# Patient Record
Sex: Female | Born: 1945 | Race: White | Smoking: Never smoker
Health system: Southern US, Community
[De-identification: ages and names within clinical notes are randomized; demographics above are authoritative.]

## PROBLEM LIST (undated history)

## (undated) DIAGNOSIS — K219 Gastro-esophageal reflux disease without esophagitis: Secondary | ICD-10-CM

## (undated) DIAGNOSIS — R7303 Prediabetes: Secondary | ICD-10-CM

## (undated) DIAGNOSIS — G4733 Obstructive sleep apnea (adult) (pediatric): Secondary | ICD-10-CM

## (undated) DIAGNOSIS — E785 Hyperlipidemia, unspecified: Secondary | ICD-10-CM

## (undated) DIAGNOSIS — I493 Ventricular premature depolarization: Secondary | ICD-10-CM

## (undated) DIAGNOSIS — D509 Iron deficiency anemia, unspecified: Secondary | ICD-10-CM

## (undated) DIAGNOSIS — D126 Benign neoplasm of colon, unspecified: Secondary | ICD-10-CM

## (undated) HISTORY — DX: Obstructive sleep apnea (adult) (pediatric): G47.33

## (undated) HISTORY — DX: Benign neoplasm of colon, unspecified: D12.6

## (undated) HISTORY — DX: Prediabetes: R73.03

## (undated) HISTORY — DX: Gastro-esophageal reflux disease without esophagitis: K21.9

## (undated) HISTORY — DX: Iron deficiency anemia, unspecified: D50.9

## (undated) HISTORY — PX: COLONOSCOPY: SHX174

## (undated) HISTORY — DX: Ventricular premature depolarization: I49.3

## (undated) HISTORY — DX: Hyperlipidemia, unspecified: E78.5

## (undated) HISTORY — PX: OVARIAN CYST REMOVAL: SHX89

## (undated) HISTORY — PX: TUBAL LIGATION: SHX77

---

## 2017-10-03 ENCOUNTER — Ambulatory Visit (HOSPITAL_COMMUNITY): Payer: Medicare HMO | Attending: Cardiovascular Disease

## 2017-10-03 ENCOUNTER — Encounter: Payer: Self-pay | Admitting: Cardiovascular Disease

## 2017-10-03 ENCOUNTER — Other Ambulatory Visit: Payer: Self-pay

## 2017-10-03 ENCOUNTER — Ambulatory Visit (INDEPENDENT_AMBULATORY_CARE_PROVIDER_SITE_OTHER): Payer: Medicare HMO | Admitting: Cardiovascular Disease

## 2017-10-03 DIAGNOSIS — I10 Essential (primary) hypertension: Secondary | ICD-10-CM | POA: Diagnosis not present

## 2017-10-03 DIAGNOSIS — G4733 Obstructive sleep apnea (adult) (pediatric): Secondary | ICD-10-CM

## 2017-10-03 DIAGNOSIS — R002 Palpitations: Secondary | ICD-10-CM | POA: Insufficient documentation

## 2017-10-03 DIAGNOSIS — E785 Hyperlipidemia, unspecified: Secondary | ICD-10-CM | POA: Insufficient documentation

## 2017-10-03 DIAGNOSIS — I493 Ventricular premature depolarization: Secondary | ICD-10-CM | POA: Diagnosis not present

## 2017-10-03 DIAGNOSIS — E7800 Pure hypercholesterolemia, unspecified: Secondary | ICD-10-CM

## 2017-10-03 LAB — ECHOCARDIOGRAM COMPLETE
Height: 61 in
Weight: 2713.6 oz

## 2017-10-03 MED ORDER — METOPROLOL SUCCINATE ER 25 MG PO TB24: 25.0000 mg | ORAL_TABLET | Freq: Every day | ORAL | 6 refills | Status: DC

## 2017-10-03 NOTE — Progress Notes (Signed)
10/03/2017 Kelly Stone   12-21-1945  [id number]  Primary Physician Kelly Galas, MD Primary Cardiologist: Kelly Harp MD Kelly Stone, [place]  HPI:  Kelly Stone is a 72 y.o. mildly overweight widowed Caucasian female mother of 2, grandmother of 2 grandchildren referred by Dr. 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  She basically has no risk factors other than family history with both parents who have had stents.  She has some mild hyperlipidemia not on statin therapy.  She is never had a heart attack or stroke and denies chest pain or shortness of breath.  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  Does not drink caffeine or alcohol.  She is had palpitations for years more noticeable and frequent over the last several months.  Her primary care doctor obtain a event monitor that showed frequent PVCs.   Current Meds  Medication Sig  . Multiple Vitamins-Minerals (MULTIVITAMIN ADULT PO) Take 1 tablet by mouth daily.     Not on File  Social History   Socioeconomic History  . Marital status: Widowed    Spouse name: Not on file  . Number of children: Not on file  . Years of education: Not on file  . Highest education level: Not on file  Occupational History  . Not on file  Social Needs  . Financial resource strain: Not on file  . Food insecurity:    Worry: Not on file    Inability: Not on file  . Transportation needs:    Medical: Not on file    Non-medical: Not on file  Tobacco Use  . Smoking status: Never Smoker  . Smokeless tobacco: Never Used  Substance and Sexual Activity  . Alcohol use: Never    Frequency: Never  . Drug use: Never  . Sexual activity: Not on file  Lifestyle  . Physical activity:    Days per week: Not on file    Minutes per session: Not on file  . Stress: Not on file  Relationships  . Social connections:    Talks on phone: Not on file    Gets together: Not on  file    Attends religious service: Not on file    Active member of club or organization: Not on file    Attends meetings of clubs or organizations: Not on file    Relationship status: Not on file  . Intimate partner violence:    Fear of current or ex partner: Not on file    Emotionally abused: Not on file    Physically abused: Not on file    Forced sexual activity: Not on file  Other Topics Concern  . Not on file  Social History Narrative  . Not on file     Review of Systems: General: negative for chills, fever, night sweats or weight changes.  Cardiovascular: negative for chest pain, dyspnea on exertion, edema, orthopnea, palpitations, paroxysmal nocturnal dyspnea or shortness of breath Dermatological: negative for rash Respiratory: negative for cough or wheezing Urologic: negative for hematuria Abdominal: negative for nausea, vomiting, diarrhea, bright red blood per rectum, melena, or hematemesis Neurologic: negative for visual changes, syncope, or dizziness All other systems reviewed and are otherwise negative except as noted above.    Blood pressure 126/80, pulse 71, height 5\' 1"  (1.549 m), weight 169 lb 9.6 oz (76.9 kg).  General appearance: alert and no distress Neck: no adenopathy, no  carotid bruit, no JVD, supple, symmetrical, trachea midline and thyroid not enlarged, symmetric, no tenderness/mass/nodules Lungs: clear to auscultation bilaterally Heart: regular rate and rhythm, S1, S2 normal, no murmur, click, rub or gallop Extremities: extremities normal, atraumatic, no cyanosis or edema Pulses: 2+ and symmetric Skin: Skin color, texture, turgor normal. No rashes or lesions Neurologic: Alert and oriented X 3, normal strength and tone. Normal symmetric reflexes. 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  They have been more noticeable and she is been more symptomatic for the last several months.  She has stopped caffeine many months ago and does not drink alcohol.  She is grieving with because of the loss of her husband 2 years ago.  She apparently had a recent event monitor that showed PVCs by her PCP.  I am going to get a 2D echo and begin her empirically on low-dose beta-blockade..  Obstructive sleep apnea Obstructive sleep apnea on CPAP      Kelly Harp MD [facility], [place], [facility] 10/03/2017 10:01 AM

## 2017-10-03 NOTE — Assessment & Plan Note (Signed)
History of hyperlipidemia not on statin therapy. 

## 2017-10-03 NOTE — Assessment & Plan Note (Signed)
Kelly Stone has had PVCs for years.  They have been more noticeable and she is been more symptomatic for the last several months.  She has stopped caffeine many months ago and does not drink alcohol.  She is grieving with because of the loss of her husband 2 years ago.  She apparently had a recent event monitor that showed PVCs by her PCP.  I am going to get a 2D echo and begin her empirically on low-dose beta-blockade.Marland Kitchen

## 2017-10-03 NOTE — Patient Instructions (Signed)
Medication Instructions: Your physician recommends that you continue on your current medications as directed. Please refer to the Current Medication list given to you today.  START Metoprolol Succinate 25 mg daily.   Testing/Procedures: Your physician has requested that you have an echocardiogram. Echocardiography is a painless test that uses sound waves to create images of your heart. It provides your doctor with information about the size and shape of your heart and how well your heart's chambers and valves are working. This procedure takes approximately one hour. There are no restrictions for this procedure.  Follow-Up: Your physician recommends that you schedule a follow-up appointment in: 6 weeks with Dr. Gwenlyn Found.

## 2017-10-03 NOTE — Assessment & Plan Note (Signed)
Obstructive sleep apnea on CPAP

## 2017-10-28 ENCOUNTER — Encounter: Payer: Self-pay | Admitting: Cardiovascular Disease

## 2017-11-03 ENCOUNTER — Telehealth: Payer: Self-pay

## 2017-11-03 DIAGNOSIS — E7800 Pure hypercholesterolemia, unspecified: Secondary | ICD-10-CM

## 2017-11-03 DIAGNOSIS — I493 Ventricular premature depolarization: Secondary | ICD-10-CM

## 2017-11-03 NOTE — Telephone Encounter (Signed)
Per Dr. Gwenlyn Found, get a lipid and liver profile as well as a TSH and free T4. Sent message to patient through MyChart to go to [place] office for lab work.

## 2017-11-20 ENCOUNTER — Other Ambulatory Visit: Payer: Medicare HMO | Admitting: *Deleted

## 2017-11-20 DIAGNOSIS — E7800 Pure hypercholesterolemia, unspecified: Secondary | ICD-10-CM

## 2017-11-20 DIAGNOSIS — I493 Ventricular premature depolarization: Secondary | ICD-10-CM

## 2017-11-20 LAB — HEPATIC FUNCTION PANEL
ALK PHOS: 59 IU/L (ref 39–117)
ALT: 41 IU/L — AB (ref 0–32)
AST: 30 IU/L (ref 0–40)
Albumin: 4.5 g/dL (ref 3.5–4.8)
BILIRUBIN TOTAL: 0.3 mg/dL (ref 0.0–1.2)
BILIRUBIN, DIRECT: 0.09 mg/dL (ref 0.00–0.40)
Total Protein: 6.5 g/dL (ref 6.0–8.5)

## 2017-11-20 LAB — LIPID PANEL
CHOLESTEROL TOTAL: 182 mg/dL (ref 100–199)
Chol/HDL Ratio: 3.4 ratio (ref 0.0–4.4)
HDL: 53 mg/dL (ref >39)
LDL CALC: 111 mg/dL — AB (ref 0–99)
Triglycerides: 90 mg/dL (ref 0–149)
VLDL CHOLESTEROL CAL: 18 mg/dL (ref 5–40)

## 2017-11-20 LAB — TSH: TSH: 4.23 u[IU]/mL (ref 0.450–4.500)

## 2017-11-20 LAB — T4, FREE: FREE T4: 1.02 ng/dL (ref 0.82–1.77)

## 2017-12-03 ENCOUNTER — Ambulatory Visit: Payer: Medicare HMO | Admitting: Cardiovascular Disease

## 2018-07-14 ENCOUNTER — Other Ambulatory Visit: Payer: Self-pay | Admitting: Cardiovascular Disease

## 2018-08-05 ENCOUNTER — Encounter: Payer: Self-pay | Admitting: *Deleted

## 2018-08-05 ENCOUNTER — Telehealth: Payer: Self-pay | Admitting: *Deleted

## 2018-08-05 NOTE — Telephone Encounter (Signed)
Virtual Visit Pre-Appointment Phone Call  "(Name), I am calling you today to discuss your upcoming appointment. We are currently trying to limit exposure to the virus that causes COVID-19 by seeing patients at home rather than in the office."  1. "What is the BEST phone number to call the day of the visit?" - include this in appointment notes  2. Do you have or have access to (through a family member/friend) a smartphone with video capability that we can use for your visit?" a. If yes - list this number in appt notes as cell (if different from BEST phone #) and list the appointment type as a VIDEO visit in appointment notes b. If no - list the appointment type as a PHONE visit in appointment notes  3. Confirm consent - "In the setting of the current Covid19 crisis, you are scheduled for a (phone or video) visit with your provider on (date) at (time).  Just as we do with many in-office visits, in order for you to participate in this visit, we must obtain consent.  If you'd like, I can send this to your mychart (if signed up) or email for you to review.  Otherwise, I can obtain your verbal consent now.  All virtual visits are billed to your insurance company just like a normal visit would be.  By agreeing to a virtual visit, we'd like you to understand that the technology does not allow for your provider to perform an examination, and thus may limit your provider's ability to fully assess your condition. If your provider identifies any concerns that need to be evaluated in person, we will make arrangements to do so.  Finally, though the technology is pretty good, we cannot assure that it will always work on either your or our end, and in the setting of a video visit, we may have to convert it to a phone-only visit.  In either situation, we cannot ensure that we have a secure connection.  Are you willing to proceed?" STAFF: Did the patient verbally acknowledge consent to telehealth visit? Document  YES/NO here: YES  4. Advise patient to be prepared - "Two hours prior to your appointment, go ahead and check your blood pressure, pulse, oxygen saturation, and your weight (if you have the equipment to check those) and write them all down. When your visit starts, your provider will ask you for this information. If you have an Apple Watch or Kardia device, please plan to have heart rate information ready on the day of your appointment. Please have a pen and paper handy nearby the day of the visit as well."  5. Give patient instructions for MyChart download to smartphone OR [email or web address] as below if video visit (depending on what platform provider is using)  6. Inform patient they will receive a phone call 15 minutes prior to their appointment time (may be from unknown caller ID) so they should be prepared to answer    TELEPHONE CALL NOTE  Lajuan Godbee has been deemed a candidate for a follow-up tele-health visit to limit community exposure during the Covid-19 pandemic. I spoke with the patient via phone to ensure availability of phone/video source, confirm preferred email & phone number, and discuss instructions and expectations.  I reminded Destony Prevost to be prepared with any vital sign and/or heart rhythm information that could potentially be obtained via home monitoring, at the time of her visit. I reminded Malkie Wille to expect a phone call prior to her visit.  Venetia Maxon, [place] 08/05/2018 4:39 PM   INSTRUCTIONS FOR DOWNLOADING THE MYCHART APP TO SMARTPHONE  - The patient must first make sure to have activated MyChart and know their login information - If Apple, go to [employer] and type in MyChart in the search bar and download the app. If Android, ask patient to go to [employer] and type in [place] in the search bar and download the app. The app is free but as with any other app downloads, their phone may require them to verify saved payment information or Apple/Android  password.  - The patient will need to then log into the app with their MyChart username and password, and select [facility] as their healthcare provider to link the account. When it is time for your visit, go to the MyChart app, find appointments, and click Begin Video Visit. Be sure to Select Allow for your device to access the Microphone and Camera for your visit. You will then be connected, and your provider will be with you shortly.  **If they have any issues connecting, or need assistance please contact MyChart service desk [phone])**  **If using a computer, in order to ensure the best quality for their visit they will need to use either of the following Internet Browsers: [employer], or Google Chrome**  IF USING DOXIMITY or DOXY.ME - The patient will receive a link just prior to their visit by text.     FULL LENGTH CONSENT FOR TELE-HEALTH VISIT   I hereby voluntarily request, consent and authorize [facility] and its employed or contracted physicians, physician assistants, nurse practitioners or other licensed health care professionals (the Practitioner), to provide me with telemedicine health care services (the Services") as deemed necessary by the treating Practitioner. I acknowledge and consent to receive the Services by the Practitioner via telemedicine. I understand that the telemedicine visit will involve communicating with the Practitioner through live audiovisual communication technology and the disclosure of certain medical information by electronic transmission. I acknowledge that I have been given the opportunity to request an in-person assessment or other available alternative prior to the telemedicine visit and am voluntarily participating in the telemedicine visit.  I understand that I have the right to withhold or withdraw my consent to the use of telemedicine in the course of my care at any time, without affecting my right to future care or treatment,  and that the Practitioner or I may terminate the telemedicine visit at any time. I understand that I have the right to inspect all information obtained and/or recorded in the course of the telemedicine visit and may receive copies of available information for a reasonable fee.  I understand that some of the potential risks of receiving the Services via telemedicine include:   Delay or interruption in medical evaluation due to technological equipment failure or disruption;  Information transmitted may not be sufficient (e.g. poor resolution of images) to allow for appropriate medical decision making by the Practitioner; and/or   In rare instances, security protocols could fail, causing a breach of personal health information.  Furthermore, I acknowledge that it is my responsibility to provide information about my medical history, conditions and care that is complete and accurate to the best of my ability. I acknowledge that Practitioner's advice, recommendations, and/or decision may be based on factors not within their control, such as incomplete or inaccurate data provided by me or distortions of diagnostic images or specimens that may result from electronic transmissions. I understand that  the practice of medicine is not an exact science and that Practitioner makes no warranties or guarantees regarding treatment outcomes. I acknowledge that I will receive a copy of this consent concurrently upon execution via email to the email address I last provided but may also request a printed copy by calling the office of [facility].    I understand that my insurance will be billed for this visit.   I have read or had this consent read to me.  I understand the contents of this consent, which adequately explains the benefits and risks of the Services being provided via telemedicine.   I have been provided ample opportunity to ask questions regarding this consent and the Services and have had my questions  answered to my satisfaction.  I give my informed consent for the services to be provided through the use of telemedicine in my medical care  By participating in this telemedicine visit I agree to the above.        Cardiac Questionnaire:    Since your last visit or hospitalization:    1. Have you been having new or worsening chest pain? YES   2. Have you been having new or worsening shortness of breath? NO 3. Have you been having new or worsening leg swelling, wt gain, or increase in abdominal girth (pants fitting more tightly)? NO   4. Have you had any passing out spells? NO    *A YES to any of these questions would result in the appointment being kept. *If all the answers to these questions are NO, we should indicate that given the current situation regarding the worldwide coronarvirus pandemic, at the recommendation of the CDC, we are looking to limit gatherings in our waiting area, and thus will reschedule their appointment beyond four weeks from today.   _____________   COVID-19 Pre-Screening Questions:   Do you currently have a fever? NO  Have you recently travelled on a cruise, internationally, or to [place], [place], [place], [place], [place], or [place], [place] [employer]) ? NO  Have you been in contact with someone that is currently pending confirmation of Covid19 testing or has been confirmed to have the [place] virus?  NO  Are you currently experiencing fatigue or cough? NO    Spoke with patient and she agreed to have a video visit with Dr. Gwenlyn Found August 07, 2018 at 11:00am. We reviewed patient's allergies, medications, pharmacy, and history.

## 2018-08-05 NOTE — Telephone Encounter (Signed)
Virtual Visit Pre-Appointment Phone Call  "(Name), I am calling you today to discuss your upcoming appointment. We are currently trying to limit exposure to the virus that causes COVID-19 by seeing patients at home rather than in the office."  1. "What is the BEST phone number to call the day of the visit?" - include this in appointment notes  2. Do you have or have access to (through a family member/friend) a smartphone with video capability that we can use for your visit?" a. If yes - list this number in appt notes as cell (if different from BEST phone #) and list the appointment type as a VIDEO visit in appointment notes b. If no - list the appointment type as a PHONE visit in appointment notes  3. Confirm consent - "In the setting of the current Covid19 crisis, you are scheduled for a (phone or video) visit with your provider on (date) at (time).  Just as we do with many in-office visits, in order for you to participate in this visit, we must obtain consent.  If you'd like, I can send this to your mychart (if signed up) or email for you to review.  Otherwise, I can obtain your verbal consent now.  All virtual visits are billed to your insurance company just like a normal visit would be.  By agreeing to a virtual visit, we'd like you to understand that the technology does not allow for your provider to perform an examination, and thus may limit your provider's ability to fully assess your condition. If your provider identifies any concerns that need to be evaluated in person, we will make arrangements to do so.  Finally, though the technology is pretty good, we cannot assure that it will always work on either your or our end, and in the setting of a video visit, we may have to convert it to a phone-only visit.  In either situation, we cannot ensure that we have a secure connection.  Are you willing to proceed?" STAFF: Did the patient verbally acknowledge consent to telehealth visit? Document  YES/NO here: YES  4. Advise patient to be prepared - "Two hours prior to your appointment, go ahead and check your blood pressure, pulse, oxygen saturation, and your weight (if you have the equipment to check those) and write them all down. When your visit starts, your provider will ask you for this information. If you have an Apple Watch or Kardia device, please plan to have heart rate information ready on the day of your appointment. Please have a pen and paper handy nearby the day of the visit as well."  5. Give patient instructions for MyChart download to smartphone OR Doximity/Doxy.me as below if video visit (depending on what platform provider is using)  6. Inform patient they will receive a phone call 15 minutes prior to their appointment time (may be from unknown caller ID) so they should be prepared to answer    TELEPHONE CALL NOTE  Julionna Marczak has been deemed a candidate for a follow-up tele-health visit to limit community exposure during the Covid-19 pandemic. I spoke with the patient via phone to ensure availability of phone/video source, confirm preferred email & phone number, and discuss instructions and expectations.  I reminded Tanashia Ciesla to be prepared with any vital sign and/or heart rhythm information that could potentially be obtained via home monitoring, at the time of her visit. I reminded Emiline Mancebo to expect a phone call prior to her visit.  Venetia Maxon, [place] 08/05/2018 4:33 PM   INSTRUCTIONS FOR DOWNLOADING THE MYCHART APP TO SMARTPHONE  - The patient must first make sure to have activated MyChart and know their login information - If Apple, go to [employer] and type in MyChart in the search bar and download the app. If Android, ask patient to go to [employer] and type in [facility] in the search bar and download the app. The app is free but as with any other app downloads, their phone may require them to verify saved payment information or Apple/Android  password.  - The patient will need to then log into the app with their MyChart username and password, and select [facility] as their healthcare provider to link the account. When it is time for your visit, go to the MyChart app, find appointments, and click Begin Video Visit. Be sure to Select Allow for your device to access the Microphone and Camera for your visit. You will then be connected, and your provider will be with you shortly.  **If they have any issues connecting, or need assistance please contact MyChart service desk [phone])**  **If using a computer, in order to ensure the best quality for their visit they will need to use either of the following Internet Browsers: [employer], or Google Chrome**  IF USING DOXIMITY or DOXY.ME - The patient will receive a link just prior to their visit by text.     FULL LENGTH CONSENT FOR TELE-HEALTH VISIT   I hereby voluntarily request, consent and authorize [facility] and its employed or contracted physicians, physician assistants, nurse practitioners or other licensed health care professionals (the Practitioner), to provide me with telemedicine health care services (the Services") as deemed necessary by the treating Practitioner. I acknowledge and consent to receive the Services by the Practitioner via telemedicine. I understand that the telemedicine visit will involve communicating with the Practitioner through live audiovisual communication technology and the disclosure of certain medical information by electronic transmission. I acknowledge that I have been given the opportunity to request an in-person assessment or other available alternative prior to the telemedicine visit and am voluntarily participating in the telemedicine visit.  I understand that I have the right to withhold or withdraw my consent to the use of telemedicine in the course of my care at any time, without affecting my right to future care or treatment,  and that the Practitioner or I may terminate the telemedicine visit at any time. I understand that I have the right to inspect all information obtained and/or recorded in the course of the telemedicine visit and may receive copies of available information for a reasonable fee.  I understand that some of the potential risks of receiving the Services via telemedicine include:   Delay or interruption in medical evaluation due to technological equipment failure or disruption;  Information transmitted may not be sufficient (e.g. poor resolution of images) to allow for appropriate medical decision making by the Practitioner; and/or   In rare instances, security protocols could fail, causing a breach of personal health information.  Furthermore, I acknowledge that it is my responsibility to provide information about my medical history, conditions and care that is complete and accurate to the best of my ability. I acknowledge that Practitioner's advice, recommendations, and/or decision may be based on factors not within their control, such as incomplete or inaccurate data provided by me or distortions of diagnostic images or specimens that may result from electronic transmissions. I understand that  the practice of medicine is not an exact science and that Practitioner makes no warranties or guarantees regarding treatment outcomes. I acknowledge that I will receive a copy of this consent concurrently upon execution via email to the email address I last provided but may also request a printed copy by calling the office of [facility].    I understand that my insurance will be billed for this visit.   I have read or had this consent read to me.  I understand the contents of this consent, which adequately explains the benefits and risks of the Services being provided via telemedicine.   I have been provided ample opportunity to ask questions regarding this consent and the Services and have had my questions  answered to my satisfaction.  I give my informed consent for the services to be provided through the use of telemedicine in my medical care  By participating in this telemedicine visit I agree to the above.        Cardiac Questionnaire:    Since your last visit or hospitalization:    1. Have you been having new or worsening chest pain? YES   2. Have you been having new or worsening shortness of breath? NO 3. Have you been having new or worsening leg swelling, wt gain, or increase in abdominal girth (pants fitting more tightly)? NO   4. Have you had any passing out spells? NO    *A YES to any of these questions would result in the appointment being kept. *If all the answers to these questions are NO, we should indicate that given the current situation regarding the worldwide coronarvirus pandemic, at the recommendation of the CDC, we are looking to limit gatherings in our waiting area, and thus will reschedule their appointment beyond four weeks from today.   _____________   COVID-19 Pre-Screening Questions:   Do you currently have a fever? NO  Have you recently travelled on a cruise, internationally, or to [place], [place], [place], [place], [place], or [place], [place] [employer]) ? NO  Have you been in contact with someone that is currently pending confirmation of Covid19 testing or has been confirmed to have the [place] virus?  NO  Are you currently experiencing fatigue or cough? NO    Spoke with patient and she agreed to have a video visit with Dr. Gwenlyn Found August 07, 2018 at 11:00am. We reviewed patient's allergies, medications, pharmacy, and history.

## 2018-08-06 ENCOUNTER — Telehealth: Payer: Self-pay | Admitting: Cardiovascular Disease

## 2018-08-06 NOTE — Telephone Encounter (Signed)
Smartphone/ my chart/ virtual consent/ pre reg completed °

## 2018-08-07 ENCOUNTER — Telehealth: Payer: Self-pay

## 2018-08-07 ENCOUNTER — Telehealth (INDEPENDENT_AMBULATORY_CARE_PROVIDER_SITE_OTHER): Payer: Medicare HMO | Admitting: Cardiovascular Disease

## 2018-08-07 DIAGNOSIS — R0789 Other chest pain: Secondary | ICD-10-CM | POA: Diagnosis not present

## 2018-08-07 DIAGNOSIS — I493 Ventricular premature depolarization: Secondary | ICD-10-CM

## 2018-08-07 DIAGNOSIS — E782 Mixed hyperlipidemia: Secondary | ICD-10-CM | POA: Diagnosis not present

## 2018-08-07 NOTE — Telephone Encounter (Signed)
LVM STATING F/U IN 1 YR AND AVS WILL BE SENT TO ADDRESS ON FILE

## 2018-08-07 NOTE — Patient Instructions (Signed)

## 2018-08-07 NOTE — Progress Notes (Signed)
Virtual Visit via Video Note   This visit type was conducted due to national recommendations for restrictions regarding the COVID-19 Pandemic (e.g. social distancing) in an effort to limit this patient's exposure and mitigate transmission in our community.  Due to her co-morbid illnesses, this patient is at least at moderate risk for complications without adequate follow up.  This format is felt to be most appropriate for this patient at this time.  All issues noted in this document were discussed and addressed.  A limited physical exam was performed with this format.  Please refer to the patient's chart for her consent to telehealth for [facility].   Evaluation Performed:  Follow-up visit  Date:  08/07/2018   ID:  Kelly Stone, DOB 1945-07-05, MRN [id number]  Patient Location: Home Provider Location: Home  PCP:  Lysbeth Galas, MD  Cardiologist:  Quay Burow, MD  Electrophysiologist:  None   Chief Complaint: Palpitations and atypical chest pain  History of Present Illness:     Kelly Stone is a 73y.o. mildly overweight widowed Caucasian female mother of 2, grandmother of 2 grandchildren referred by Dr. 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    Her current primary care provider is Dr. Annitta Needs.  She basically has no risk factors other than family history with both parents who have had stents.  She has some mild hyperlipidemia not on statin therapy.  She is never had a heart attack or stroke and denies chest pain or shortness of breath.  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  Does not drink caffeine or alcohol.  She is had palpitations for years more noticeable and frequent over the last several months prior to her initial office visit..  Her primary care doctor obtain a event monitor that showed frequent PVCs.  Since I saw her a year ago a  2D echocardiogram was performed 10/03/2017 that was entirely normal.  She believes that her PVCs were stress related from not seeing her daughter.  Her symptoms have since resolved spontaneously.  I did prescribe low-dose metoprolol which she ended up not taking because of borderline low blood pressure.  She does complain of some atypical symptoms which sound reflux related typically after eating.  She has been on antacids off and off in the past.  Her most recent lipid profile performed 04/25/2018 revealed total cholesterol 179, LDL 106 and HDL 51.  The patient does not have symptoms concerning for COVID-19 infection (fever, chills, cough, or new shortness of breath).    No past medical history on file. No past surgical history on file.   Current Meds  Medication Sig  . Multiple Vitamins-Minerals (MULTIVITAMIN ADULT PO) Take 1 tablet by mouth daily.     Allergies:   Patient has no known allergies.   Social History   Tobacco Use  . Smoking status: Never Smoker  . Smokeless tobacco: Never Used  Substance Use Topics  . Alcohol use: Never    Frequency: Never  . Drug use: Never     Family Hx: The patient's family history is not on file.  ROS:   Please see the history of present illness.     All other systems reviewed and are negative.   Prior CV studies:   The following studies were reviewed today:  None  Labs/Other Tests and Data Reviewed:  EKG:  No ECG reviewed.  Recent Labs: 11/20/2017: ALT 41; TSH 4.230   Recent Lipid Panel Lab Results  Component Value Date/Time   CHOL 182 11/20/2017 12:00 AM   TRIG 90 11/20/2017 12:00 AM   HDL 53 11/20/2017 12:00 AM   CHOLHDL 3.4 11/20/2017 12:00 AM   LDLCALC 111 (H) 11/20/2017 12:00 AM    Wt Readings from Last 3 Encounters:  08/07/18 168 lb (76.2 kg)  10/03/17 169 lb 9.6 oz (76.9 kg)     Objective:    Vital Signs:  BP 118/69   Pulse (!) 58   Ht 5\' 1"  (1.549 m)   Wt 168 lb (76.2 kg)   BMI 31.74 kg/m    VITAL  SIGNS:  reviewed GEN:  no acute distress RESPIRATORY:  normal respiratory effort, symmetric expansion NEURO:  alert and oriented x 3, no obvious focal deficit PSYCH:  normal affect  ASSESSMENT & PLAN:    1. Palpitations- PVCs on event monitoring performed by her prior PCP.  These have since resolved.  She does not drink alcohol or caffeine.  She believes these were stress related.  She ended up not taking the beta-blocker which I prescribed. 2. Hyperlipidemia- lipid profile performed 04/25/2018 revealed total cholesterol 179, LDL 106 and HDL 51 3. Atypical chest pain- sounds like reflux  COVID-19 Education: The signs and symptoms of COVID-19 were discussed with the patient and how to seek care for testing (follow up with PCP or arrange E-visit).  The importance of social distancing was discussed today.  Time:   Today, I have spent 8 minutes with the patient with telehealth technology discussing the above problems.     Medication Adjustments/Labs and Tests Ordered: Current medicines are reviewed at length with the patient today.  Concerns regarding medicines are outlined above.   Tests Ordered: No orders of the defined types were placed in this encounter.   Medication Changes: No orders of the defined types were placed in this encounter.   Disposition:  Follow up in 1 year(s)  Signed, Quay Burow, MD  08/07/2018 11:02 AM    [place]

## 2019-01-08 ENCOUNTER — Encounter: Payer: Self-pay | Admitting: Cardiovascular Disease

## 2019-01-08 ENCOUNTER — Other Ambulatory Visit: Payer: Self-pay

## 2019-01-08 ENCOUNTER — Ambulatory Visit: Payer: Medicare HMO | Admitting: Cardiovascular Disease

## 2019-01-08 DIAGNOSIS — Z8249 Family history of ischemic heart disease and other diseases of the circulatory system: Secondary | ICD-10-CM | POA: Diagnosis not present

## 2019-01-08 DIAGNOSIS — E782 Mixed hyperlipidemia: Secondary | ICD-10-CM

## 2019-01-08 DIAGNOSIS — I493 Ventricular premature depolarization: Secondary | ICD-10-CM | POA: Diagnosis not present

## 2019-01-08 NOTE — Assessment & Plan Note (Signed)
History of hyperlipidemia not on statin therapy with recent lipid profile performed 12/31/2018 revealing a total cholesterol 197 and LDL 129.  She does admit to dietary indiscretion.  She is not on statin therapy.  We talked about the importance of dietary modification she is aware of.  If her coronary calcium score comes back elevated we will focus on risk factor modification LDL reduction.Marland Kitchen

## 2019-01-08 NOTE — Assessment & Plan Note (Signed)
History of PVCs in the past.  She is it does not drink caffeine or alcohol.  She does have obstructive sleep apnea on CPAP.  She can predict what her PVCs are exacerbated based on her stress level.

## 2019-01-08 NOTE — Telephone Encounter (Signed)
Contacted pt 9/25 to follow up on whther she will present for appt today. Pt states she is on the way to [facility] office

## 2019-01-08 NOTE — Assessment & Plan Note (Signed)
Both parents have stents.  She does have some atypical chest pain which sounds more like reflux however I am going to get a coronary calcium score to risk stratify.

## 2019-01-08 NOTE — Patient Instructions (Signed)
Medication Instructions:  Your physician recommends that you continue on your current medications as directed. Please refer to the Current Medication list given to you today.  If you need a refill on your cardiac medications before your next appointment, please call your pharmacy.   Lab work: NONE If you have labs (blood work) drawn today and your tests are completely normal, you will receive your results only by: Marland Kitchen MyChart Message (if you have MyChart) OR . A paper copy in the mail If you have any lab test that is abnormal or we need to change your treatment, we will call you to review the results.  Testing/Procedures: Your physician has recommended that you have a coronary calcium scan. This will cost $150 out-of-pocket.  Coronary Calcium Scan A coronary calcium scan is an imaging test used to look for deposits of calcium and other fatty materials (plaques) in the inner lining of the blood vessels of the heart (coronary arteries). These deposits of calcium and plaques can partly clog and narrow the coronary arteries without producing any symptoms or warning signs. This puts a person at risk for a heart attack. This test can detect these deposits before symptoms develop. Tell a health care provider about:  Any allergies you have.  All medicines you are taking, including vitamins, herbs, eye drops, creams, and over-the-counter medicines.  Any problems you or family members have had with anesthetic medicines.  Any blood disorders you have.  Any surgeries you have had.  Any medical conditions you have.  Whether you are pregnant or may be pregnant. What are the risks? Generally, this is a safe procedure. However, problems may occur, including:  Harm to a pregnant woman and her unborn baby. This test involves the use of radiation. Radiation exposure can be dangerous to a pregnant woman and her unborn baby. If you are pregnant, you generally should not have this procedure done.   Slight increase in the risk of cancer. This is because of the radiation involved in the test. What happens before the procedure? No preparation is needed for this procedure. What happens during the procedure?   You will undress and remove any jewelry around your neck or chest.  You will put on a hospital gown.  Sticky electrodes will be placed on your chest. The electrodes will be connected to an electrocardiogram (ECG) machine to record a tracing of the electrical activity of your heart.  A CT scanner will take pictures of your heart. During this time, you will be asked to lie still and hold your breath for 2-3 seconds while a picture of your heart is being taken. The procedure may vary among health care providers and hospitals. What happens after the procedure?  You can get dressed.  You can return to your normal activities.  It is up to you to get the results of your test. Ask your health care provider, or the department that is doing the test, when your results will be ready. Summary  A coronary calcium scan is an imaging test used to look for deposits of calcium and other fatty materials (plaques) in the inner lining of the blood vessels of the heart (coronary arteries).  Generally, this is a safe procedure. Tell your health care provider if you are pregnant or may be pregnant.  No preparation is needed for this procedure.  A CT scanner will take pictures of your heart.  You can return to your normal activities after the scan is done. This information is not  intended to replace advice given to you by your health care provider. Make sure you discuss any questions you have with your health care provider.  Follow-Up: At [facility], you and your health needs are our priority.  As part of our continuing mission to provide you with exceptional heart care, we have created designated Provider Care Teams.  These Care Teams include your primary Cardiologist (physician) and Advanced  Practice Providers (APPs -  Physician Assistants and Nurse Practitioners) who all work together to provide you with the care you need, when you need it. You will need a follow up appointment in 12 months with Dr. Quay Burow.  Please call our office 2 months in advance to schedule this appointment.

## 2019-01-08 NOTE — Progress Notes (Signed)
01/08/2019 Kelly Stone   05-30-1945  [id number]  Primary Physician Kelly Needs, MD Primary Cardiologist: Kelly Harp MD Kelly Stone, [place]  HPI:  Kelly Stone is a 73 y.o.  mildly overweight widowed Caucasian female mother of 2, grandmother of 2 grandchildren referred by Dr. 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   Her current primary care provider is Dr. Annitta Stone.   I last saw her virtually in the office 08/07/2018. She basically has no risk factors other than family history with both parents who have had stents. She has some mild hyperlipidemia not on statin therapy. She is never had a heart attack or stroke and denies chest pain or shortness of breath. 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 Does not drink caffeine or alcohol. She is had palpitations for years more noticeable and frequent over the last several months prior to her initial office visit.. Her primary care doctor obtain a event monitor that showed frequent PVCs.  A 2D echocardiogram was performed 10/03/2017 that was entirely normal.  She believes that her PVCs were stress related from not seeing her daughter.  Her symptoms have since resolved spontaneously.  I did prescribe low-dose metoprolol which she ended up not taking because of borderline low blood pressure.  She does complain of some atypical symptoms which sound reflux related typically after eating.  She has been on antacids off and off in the past.  Her most recent lipid profile performed 12/31/2018 revealed a total cholesterol of 197, slightly elevated from her previous readings to 3 months before, LDL 129 up from 96 and HDL of 49.  She does admit to dietary indiscretion.  Since I saw her 5 months ago she is remained stable.  She denies chest pain or shortness of breath.  She does get rare feelings of weakness  which has improved with CPAP.  She has had recent escalation in her PVCs related to stress from work.  Current Meds  Medication Sig  . Multiple Vitamins-Minerals (MULTIVITAMIN ADULT PO) Take 1 tablet by mouth daily.     No Known Allergies  Social History   Socioeconomic History  . Marital status: Widowed    Spouse name: Not on file  . Number of children: Not on file  . Years of education: Not on file  . Highest education level: Not on file  Occupational History  . Not on file  Social Stone  . Financial resource strain: Not on file  . Food insecurity    Worry: Not on file    Inability: Not on file  . Transportation Stone    Medical: Not on file    Non-medical: Not on file  Tobacco Use  . Smoking status: Never Smoker  . Smokeless tobacco: Never Used  Substance and Sexual Activity  . Alcohol use: Never    Frequency: Never  . Drug use: Never  . Sexual activity: Not on file  Lifestyle  . Physical activity    Days per week: Not on file    Minutes per session: Not on file  . Stress: Not on file  Relationships  . Social Herbalist on phone: Not on file    Gets together: Not on file    Attends religious service: Not on file    Active member of club or organization:  Not on file    Attends meetings of clubs or organizations: Not on file    Relationship status: Not on file  . Intimate partner violence    Fear of current or ex partner: Not on file    Emotionally abused: Not on file    Physically abused: Not on file    Forced sexual activity: Not on file  Other Topics Concern  . Not on file  Social History Narrative  . Not on file     Review of Systems: General: negative for chills, fever, night sweats or weight changes.  Cardiovascular: negative for chest pain, dyspnea on exertion, edema, orthopnea, palpitations, paroxysmal nocturnal dyspnea or shortness of breath Dermatological: negative for rash Respiratory: negative for cough or wheezing Urologic:  negative for hematuria Abdominal: negative for nausea, vomiting, diarrhea, bright red blood per rectum, melena, or hematemesis Neurologic: negative for visual changes, syncope, or dizziness All other systems reviewed and are otherwise negative except as noted above.    Blood pressure (!) 152/82, pulse (!) 58, height 5\' 1"  (1.549 m), weight 168 lb 12.8 oz (76.6 kg).  General appearance: alert and no distress Neck: no adenopathy, no carotid bruit, no JVD, supple, symmetrical, trachea midline and thyroid not enlarged, symmetric, no tenderness/mass/nodules Lungs: clear to auscultation bilaterally Heart: regular rate and rhythm, S1, S2 normal, no murmur, click, rub or gallop Extremities: extremities normal, atraumatic, no cyanosis or edema Pulses: 2+ and symmetric Skin: Skin color, texture, turgor normal. No rashes or lesions Neurologic: Alert and oriented X 3, normal strength and tone. Normal symmetric reflexes. Normal coordination and gait  EKG sinus bradycardia 58 without ST or T wave changes.  I personally reviewed this EKG.  ASSESSMENT AND PLAN:   Family history of heart disease Both parents have stents.  She does have some atypical chest pain which sounds more like reflux however I am going to get a coronary calcium score to risk stratify.  PVC's (premature ventricular contractions) History of PVCs in the past.  She is it does not drink caffeine or alcohol.  She does have obstructive sleep apnea on CPAP.  She can predict what her PVCs are exacerbated based on her stress level.  Hyperlipidemia History of hyperlipidemia not on statin therapy with recent lipid profile performed 12/31/2018 revealing a total cholesterol 197 and LDL 129.  She does admit to dietary indiscretion.  She is not on statin therapy.  We talked about the importance of dietary modification she is aware of.  If her coronary calcium score comes back elevated we will focus on risk factor modification LDL  reduction.Kelly Harp MD FACP,FACC,FAHA, [facility] 01/08/2019 9:19 AM

## 2019-02-18 ENCOUNTER — Ambulatory Visit (INDEPENDENT_AMBULATORY_CARE_PROVIDER_SITE_OTHER)
Admission: RE | Admit: 2019-02-18 | Discharge: 2019-02-18 | Disposition: A | Discharge status: Home / Self Care | Payer: Self-pay | Source: Ambulatory Visit | Attending: Cardiovascular Disease | Admitting: Cardiovascular Disease

## 2019-02-18 ENCOUNTER — Other Ambulatory Visit: Payer: Self-pay

## 2019-02-18 DIAGNOSIS — Z8249 Family history of ischemic heart disease and other diseases of the circulatory system: Secondary | ICD-10-CM

## 2019-03-05 ENCOUNTER — Telehealth: Payer: Self-pay | Admitting: Cardiovascular Disease

## 2019-03-05 NOTE — Telephone Encounter (Signed)
Spoke with pt and had recent appt with PCP and in reviewing the Ca score there was noted mild cardiac enlargement and pt's PCP asked if maybe a repeat echo needed to be done Also pt has noted on occasion while walking a head fullness and feels like maybe is not walking a straight line Will forward to Dr Gwenlyn Found for review and recommendations ./cy

## 2019-03-05 NOTE — Telephone Encounter (Signed)
Pt aware of Dr Berry's response ./cy 

## 2019-03-05 NOTE — Telephone Encounter (Signed)
Patient is calling in regards to some questions about test results.

## 2019-03-05 NOTE — Telephone Encounter (Signed)
2D from 1 yr ago showed nl cardiac size and nl LV FXN. No need to repeat . Cor CA score 0 w/o ANY evid of CAD

## 2019-03-18 ENCOUNTER — Encounter: Payer: Self-pay | Admitting: Critical Care Medicine

## 2019-03-18 NOTE — Progress Notes (Signed)
Synopsis: Referred in December 2020 for abnormal chest CT by Annitta Needs, MD.  Subjective:   PATIENT ID: Kelly Stone GENDER: female DOB: [birth year], MRN: [id number]  Chief Complaint  Patient presents with  . Consult    Patient has been referred here for pulmonary scarring. Patient states that she has a cough with yellow sputum and states that she feels like she constatly has to clear her throat.    Kelly Stone is a 73 year old woman referred for evaluation of an abnormal CT scan.  She has an occasional cough associated with throat clearing, but denies significant sputum production, dyspnea, wheezing.  She has no activity limitation and walks 1 mile per day.  She endorses allergies with postnasal drip and sneezing.  She has GERD with frequent symptoms which she only treats with Tums.  She has a history of bronchitis which was frequent for a few years when she was off allergy shots about 20 or 30 years ago.  These episodes would last several weeks at a time.  She has no known history of other pulmonary infections or childhood pulmonary issues.  She never smoked or vaped.  She was never exposed to asbestos, but her husband worked as an Forensic psychologist and she is concerned that she could have had exposure through him.  Her family history is notable for lung disease in her father, he underwent a left lower lobectomy for an unknown chronic disease.  She has not yet had her seasonal flu shot and does not want one.     Past Medical History:  Diagnosis Date  . Adenomatous colon polyp   . GERD (gastroesophageal reflux disease)   . HLD (hyperlipidemia)   . Iron deficiency anemia   . OSA (obstructive sleep apnea)   . Prediabetes   . PVC's (premature ventricular contractions)      Family History  Problem Relation Age of Onset  . CAD Mother   . Diabetes Mother   . CAD Father   . Stroke Father   . Arthritis Father   . Breast cancer Sister   . Arthritis Sister   . Stroke Brother   . Hypertension  Brother   . Hyperlipidemia Brother   . Diabetes Brother      Past Surgical History:  Procedure Laterality Date  . COLONOSCOPY    . OVARIAN CYST REMOVAL    . TUBAL LIGATION      Social History   Socioeconomic History  . Marital status: Widowed    Spouse name: Not on file  . Number of children: Not on file  . Years of education: Not on file  . Highest education level: Not on file  Occupational History  . Not on file  Social Needs  . Financial resource strain: Not on file  . Food insecurity    Worry: Not on file    Inability: Not on file  . Transportation needs    Medical: Not on file    Non-medical: Not on file  Tobacco Use  . Smoking status: Never Smoker  . Smokeless tobacco: Never Used  Substance and Sexual Activity  . Alcohol use: Never    Frequency: Never  . Drug use: Never  . Sexual activity: Not on file  Lifestyle  . Physical activity    Days per week: Not on file    Minutes per session: Not on file  . Stress: Not on file  Relationships  . Social connections    Talks on phone: Not on file  Gets together: Not on file    Attends religious service: Not on file    Active member of club or organization: Not on file    Attends meetings of clubs or organizations: Not on file    Relationship status: Not on file  . Intimate partner violence    Fear of current or ex partner: Not on file    Emotionally abused: Not on file    Physically abused: Not on file    Forced sexual activity: Not on file  Other Topics Concern  . Not on file  Social History Narrative  . Not on file     No Known Allergies   Immunization History  Administered Date(s) Administered  . Influenza, Seasonal, Injecte, Preservative Fre 03/05/2010, 01/14/2012  . Tdap 12/06/2013    Outpatient Medications Prior to Visit  Medication Sig Dispense Refill  . metFORMIN (GLUCOPHAGE) 500 MG tablet Take 500 mg by mouth daily.    . Multiple Vitamins-Minerals (MULTIVITAMIN ADULT PO) Take 1 tablet by  mouth daily.     No facility-administered medications prior to visit.     Review of Systems  Constitutional: Negative for chills, fever and weight loss.  HENT:       Post-nasal drip, throat clearing  Eyes: Negative.   Respiratory: Positive for cough and sputum production. Negative for shortness of breath and wheezing.   Cardiovascular: Negative for chest pain and leg swelling.  Gastrointestinal: Positive for heartburn. Negative for blood in stool, nausea and vomiting.  Genitourinary: Negative.   Musculoskeletal: Negative.        Hip pain  Neurological: Negative.   Endo/Heme/Allergies: Positive for environmental allergies.     Objective:   Vitals:   03/19/19 1348  BP: 102/62  Pulse: 69  Temp: 97.8 F (36.6 C)  TempSrc: Temporal  SpO2: 96%  Weight: 173 lb (78.5 kg)  Height: 5' 2.99" (1.6 m)   96% on   RA BMI Readings from Last 3 Encounters:  03/19/19 30.65 kg/m  01/08/19 31.89 kg/m  08/07/18 31.74 kg/m   Wt Readings from Last 3 Encounters:  03/19/19 173 lb (78.5 kg)  01/08/19 168 lb 12.8 oz (76.6 kg)  08/07/18 168 lb (76.2 kg)    Physical Exam Vitals signs reviewed.  Constitutional:      General: She is not in acute distress.    Appearance: Normal appearance. She is not ill-appearing.  HENT:     Head: Normocephalic and atraumatic.     Comments: Small right-sided mobile, subcentimeter soft mass along her Heide Spark reports that has been this way for many years unchanged.    Nose:     Comments: Deferred due to masking requirement.    Mouth/Throat:     Comments: Deferred due to masking requirement. Eyes:     General: No scleral icterus. Neck:     Musculoskeletal: Neck supple.  Cardiovascular:     Rate and Rhythm: Normal rate and regular rhythm.     Heart sounds: No murmur.  Pulmonary:     Comments: Breathing comfortably on room air, no conversational dyspnea.  CTA B. Abdominal:     General: There is no distension.     Palpations: Abdomen is soft.      Tenderness: There is no abdominal tenderness.  Musculoskeletal:        General: No swelling or deformity.  Lymphadenopathy:     Cervical: No cervical adenopathy.  Skin:    General: Skin is warm and dry.     Findings: No rash.  Neurological:     General: No focal deficit present.     Mental Status: She is alert.     Motor: No weakness.     Coordination: Coordination normal.  Psychiatric:        Mood and Affect: Mood normal.        Behavior: Behavior normal.      CBC No results found for: WBC, RBC, HGB, HCT, PLT, MCV, MCH, MCHC, RDW, LYMPHSABS, MONOABS, EOSABS, BASOSABS  CHEMISTRY No results for input(s): NA, K, CL, CO2, GLUCOSE, BUN, CREATININE, CALCIUM, MG, PHOS in the last 168 hours. CrCl cannot be calculated (No successful lab value found.).   Chest Imaging- films reviewed: CT cardiac score 02/18/2019, lung films reviewed - airway thickening and mild dilation in the right middle lobe and lingula, less so in the proximal bilateral lower lobes.  Linear scar in RLL and inferior lingula, superior left lower lobe.  Likely mucous plugging in anterior medial RML.   Pulmonary Functions Testing Results: No flowsheet data found.  Echocardiogram 10/03/2017: LVEF 60 to [id number], normal diastolic function.  Normal LA, RV, RA.  Trivial TR, otherwise normal valves.     Assessment & Plan:     ICD-10-CM   1. Abnormal chest CT  R93.89   2. Abnormal findings on diagnostic imaging of lung  R91.8 CT Chest Wo Contrast    Abnormal chest CT, possible bronchiectasis versus acute inflammation due to bronchitis. -Complete a full chest CT to further evaluate -Recommend optimizing GERD management  GERD -Start twice daily Pepcid; agree with her concerns regarding long-term use of PPIs  RTC PRN. I will follow up with her after her CT scan and let her know if additional workup is required.    Current Outpatient Medications:  .  metFORMIN (GLUCOPHAGE) 500 MG tablet, Take 500 mg by mouth daily.,  Disp: , Rfl:  .  Multiple Vitamins-Minerals (MULTIVITAMIN ADULT PO), Take 1 tablet by mouth daily., Disp: , Rfl:    Julian Hy, DO [facility] 03/19/2019 2:02 PM

## 2019-03-19 ENCOUNTER — Other Ambulatory Visit: Payer: Self-pay

## 2019-03-19 ENCOUNTER — Encounter: Payer: Self-pay | Admitting: Critical Care Medicine

## 2019-03-19 ENCOUNTER — Ambulatory Visit (INDEPENDENT_AMBULATORY_CARE_PROVIDER_SITE_OTHER): Payer: Medicare HMO | Admitting: Critical Care Medicine

## 2019-03-19 VITALS — BP 102/62 | HR 69 | Temp 97.8°F | Ht 62.99 in | Wt 173.0 lb

## 2019-03-19 DIAGNOSIS — R918 Other nonspecific abnormal finding of lung field: Secondary | ICD-10-CM

## 2019-03-19 DIAGNOSIS — R9389 Abnormal findings on diagnostic imaging of other specified body structures: Secondary | ICD-10-CM | POA: Diagnosis not present

## 2019-03-19 MED ORDER — FAMOTIDINE 20 MG PO TABS: 20.0000 mg | ORAL_TABLET | Freq: Two times a day (BID) | ORAL | 3 refills | Status: DC

## 2019-03-19 NOTE — Patient Instructions (Addendum)
Thank you for visiting Dr. Carlis Abbott at [facility]. We recommend the following: Orders Placed This Encounter  Procedures  . CT Chest Wo Contrast   Orders Placed This Encounter  Procedures  . CT Chest Wo Contrast    Standing Status:   Future    Standing Expiration Date:   05/18/2020    Order Specific Question:   ** REASON FOR EXAM (FREE TEXT)    Answer:   evaluate nodules and airway thickening vs bronchiectasis seen on coronary CT    Order Specific Question:   Preferred imaging location?    Answer:   [facility]    Order Specific Question:   Radiology Contrast Protocol - do NOT remove file path    Answer:   [email or web address]    Meds ordered this encounter  Medications  . famotidine (PEPCID) 20 MG tablet    Sig: Take 1 tablet (20 mg total) by mouth 2 (two) times daily.    Dispense:  30 tablet    Refill:  3    Return if symptoms worsen or fail to improve.    Please do your part to reduce the spread of COVID-19.

## 2019-03-30 ENCOUNTER — Inpatient Hospital Stay: Admission: RE | Admit: 2019-03-30 | Payer: Medicare HMO | Source: Ambulatory Visit

## 2019-04-02 ENCOUNTER — Ambulatory Visit (INDEPENDENT_AMBULATORY_CARE_PROVIDER_SITE_OTHER)
Admission: RE | Admit: 2019-04-02 | Discharge: 2019-04-02 | Disposition: A | Discharge status: Home / Self Care | Payer: Medicare HMO | Source: Ambulatory Visit | Attending: Critical Care Medicine | Admitting: Critical Care Medicine

## 2019-04-02 ENCOUNTER — Other Ambulatory Visit: Payer: Self-pay

## 2019-04-02 DIAGNOSIS — R918 Other nonspecific abnormal finding of lung field: Secondary | ICD-10-CM | POA: Diagnosis not present

## 2019-11-28 LAB — COLOGUARD

## 2019-12-18 LAB — COLOGUARD: COLOGUARD: NEGATIVE

## 2020-08-18 ENCOUNTER — Encounter: Payer: Self-pay | Admitting: Cardiovascular Disease

## 2020-08-18 ENCOUNTER — Ambulatory Visit: Payer: Medicare HMO | Admitting: Cardiovascular Disease

## 2020-08-18 ENCOUNTER — Other Ambulatory Visit: Payer: Self-pay

## 2020-08-18 DIAGNOSIS — G4733 Obstructive sleep apnea (adult) (pediatric): Secondary | ICD-10-CM

## 2020-08-18 DIAGNOSIS — E782 Mixed hyperlipidemia: Secondary | ICD-10-CM

## 2020-08-18 DIAGNOSIS — I493 Ventricular premature depolarization: Secondary | ICD-10-CM

## 2020-08-18 NOTE — Assessment & Plan Note (Signed)
History of obstructive sleep apnea on CPAP which she benefits from 

## 2020-08-18 NOTE — Assessment & Plan Note (Signed)
History of hyperlipidemia not on statin therapy with lipid profile performed 08/02/2020 by her PCP revealing a total cholesterol 175, LDL of 102 and HDL of 61.

## 2020-08-18 NOTE — Progress Notes (Signed)
08/18/2020 Kelly Stone   15-Sep-1945  [id number]  Primary Physician Annitta Needs, MD Primary Cardiologist: Lorretta Harp MD [name], [place]  HPI:  Kelly Stone is a 75 y.o..mildly overweight widowed Caucasian female mother of 2, grandmother of 2 grandchildren referred by Dr. Mammie Lorenzo for evaluation treatment of intermittent PVCs.Her current primary care provider is Dr. Annitta Needs.  I last saw her in the office 01/08/2019. She basically has no risk factors other than family history with both parents who have had stents. She has some mild hyperlipidemia not on statin therapy. She is never had a heart attack or stroke and denies chest pain or shortness of breath. 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 Does not drink caffeine or alcohol. She is had palpitations for years more noticeable and frequent over the last several monthsprior to her initial office visit.. Her primary care doctor obtain a event monitor that showed frequent PVCs.  A 2D echocardiogram was performed 10/03/2017 that was entirely normal. She believes that her PVCs were stress related from not seeing her daughter. Her symptoms have since resolved spontaneously. I did prescribe low-dose metoprolol which she ended up not taking because of borderline low blood pressure. She does complain of some atypical symptoms which sound reflux related typically after eating. She has been on antacids off and off in the past. Her most recent lipid profile performed 12/31/2018 revealed a total cholesterol of 197, slightly elevated from her previous readings to 3 months before, LDL 129 up from 96 and HDL of 49.  She does admit to dietary indiscretion.  Since I saw her in the office a year ago she continues to do well.  She still wears her CPAP.  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  She denies chest pain or shortness of breath.  Current Meds  Medication Sig  . Multiple Vitamins-Minerals (MULTIVITAMIN ADULT PO) Take 1 tablet by mouth daily.     No Known Allergies  Social History   Socioeconomic History  . Marital status: Widowed    Spouse name: Not on file  . Number of children: Not on file  . Years of education: Not on file  . Highest education level: Not on file  Occupational History  . Not on file  Tobacco Use  . Smoking status: Never Smoker  . Smokeless tobacco: Never Used  Vaping Use  . Vaping Use: Never used  Substance and Sexual Activity  . Alcohol use: Never  . Drug use: Never  . Sexual activity: Not on file  Other Topics Concern  . Not on file  Social History Narrative  . Not on file   Social Determinants of Health   Financial Resource Strain: Not on file  Food Insecurity: Not on file  Transportation Needs: Not on file  Physical Activity: Not on file  Stress: Not on file  Social Connections: Not on file  Intimate Partner Violence: Not on file     Review of Systems: General: negative for chills, fever, night sweats or weight changes.  Cardiovascular: negative for chest pain, dyspnea on exertion, edema, orthopnea, palpitations, paroxysmal nocturnal dyspnea or shortness of breath Dermatological: negative for rash Respiratory: negative for cough or wheezing Urologic: negative for hematuria Abdominal: negative for nausea, vomiting, diarrhea, bright red blood per rectum, melena, or hematemesis Neurologic:  negative for visual changes, syncope, or dizziness All other systems reviewed and are otherwise negative except as noted above.    Blood pressure 128/70, pulse (!) 54, height 5\' 2"  (1.575 m), weight 151 lb 6.4 oz (68.7 kg), SpO2 97 %.  General appearance: alert and no distress Neck: no adenopathy, no carotid bruit, no JVD, supple, symmetrical, trachea midline and thyroid not enlarged, symmetric, no  tenderness/mass/nodules Lungs: clear to auscultation bilaterally Heart: regular rate and rhythm, S1, S2 normal, no murmur, click, rub or gallop Extremities: extremities normal, atraumatic, no cyanosis or edema Pulses: 2+ and symmetric Skin: Skin color, texture, turgor normal. No rashes or lesions Neurologic: Alert and oriented X 3, normal strength and tone. Normal symmetric reflexes. Normal coordination and gait  EKG sinus bradycardia 54 with low QRS voltage.  Personally reviewed this EKG.  ASSESSMENT AND PLAN:   PVC's (premature ventricular contractions) History of PVCs in the past thought to be related to stress.  She no longer has palpitations.  She does not drink caffeine or alcohol.  Hyperlipidemia History of hyperlipidemia not on statin therapy with lipid profile performed 08/02/2020 by her PCP revealing a total cholesterol 175, LDL of 102 and HDL of 61.  Obstructive sleep apnea History of obstructive sleep apnea on CPAP which she benefits from.      Lorretta Harp MD [place], [facility] 08/18/2020 8:41 AM

## 2020-08-18 NOTE — Assessment & Plan Note (Signed)
History of PVCs in the past thought to be related to stress.  She no longer has palpitations.  She does not drink caffeine or alcohol.

## 2020-08-18 NOTE — Patient Instructions (Signed)

## 2020-11-22 ENCOUNTER — Ambulatory Visit: Payer: Medicare HMO | Admitting: Cardiovascular Disease

## 2021-09-28 IMAGING — CT CT CHEST W/O CM
2 of 3 series · 15 of 36 positions shown, 18 images · non-contrast
Comparison: 02/18/2019 cardiac CT.

CLINICAL DATA: 73-year-old female with suspected postinflammatory
lung scarring on cardiac CT last month.

EXAM:
CT CHEST WITHOUT CONTRAST
TECHNIQUE: Multidetector CT imaging of the chest was performed following the
standard protocol without IV contrast.

[Series 2: thorax · axial · 0.69mm/px · z∈[-292,-54]mm · 12 of 141 slices shown, 15 images]
[im 11/141  mediastinal]
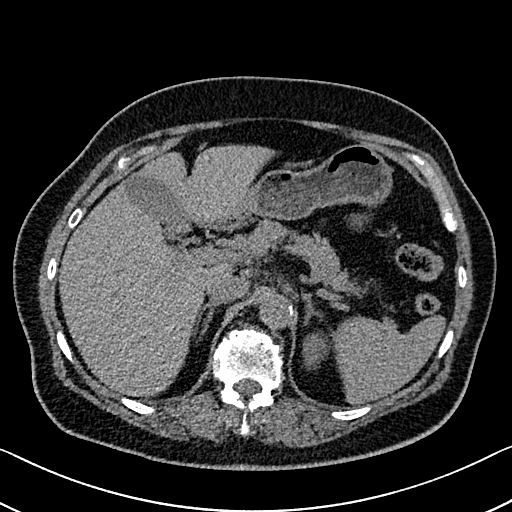
[im 11/141  lung]
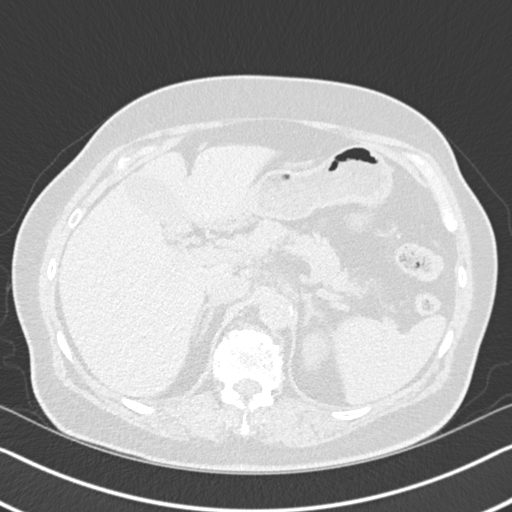
[im 21/141  lung]
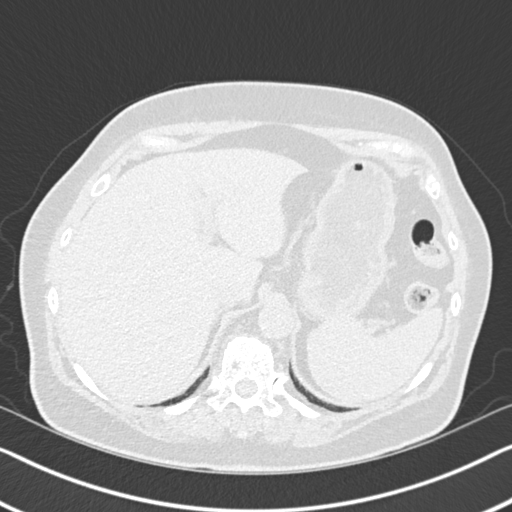
[im 32/141  lung]
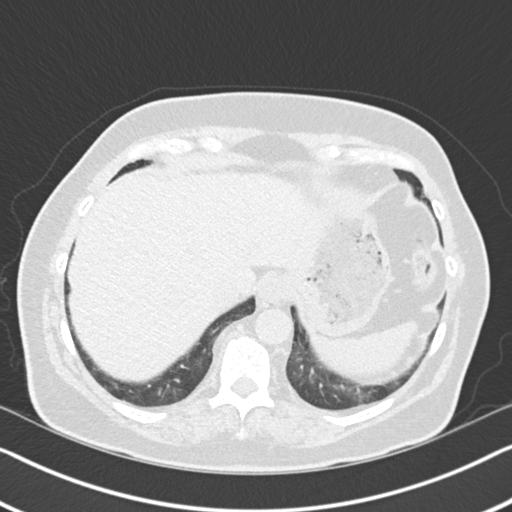
[im 42/141  lung]
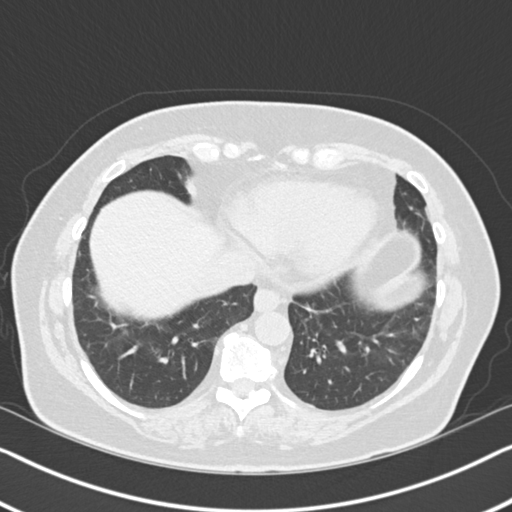
[im 52/141  mediastinal]
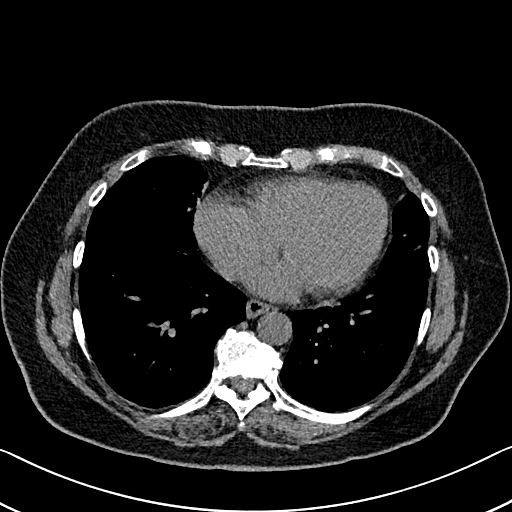
[im 52/141  lung]
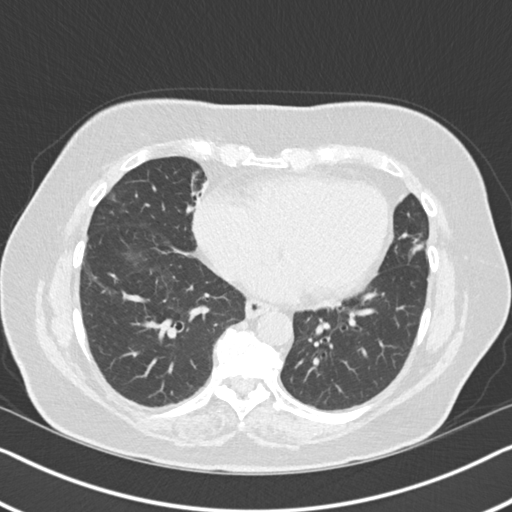
[im 63/141  lung]
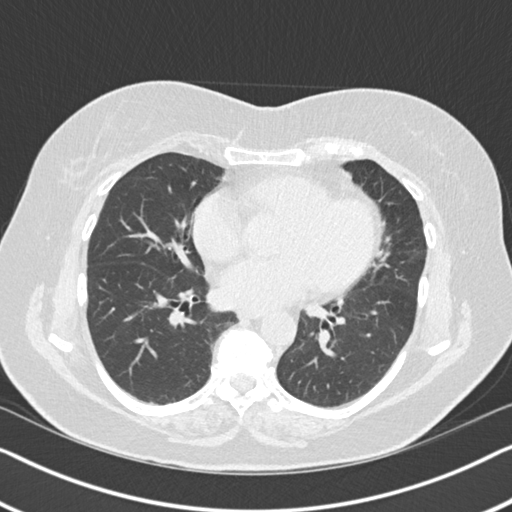
[im 78/141  lung]
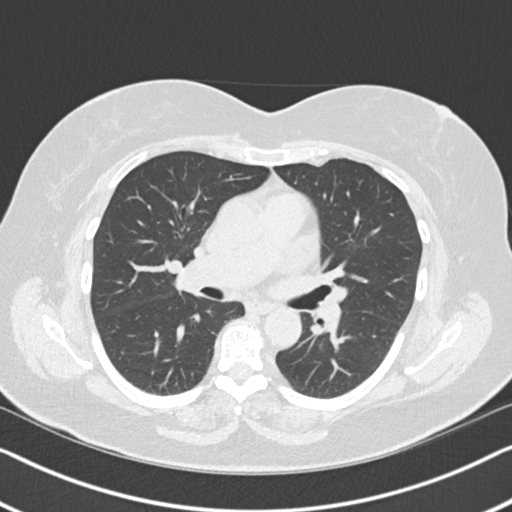
[im 89/141  lung]
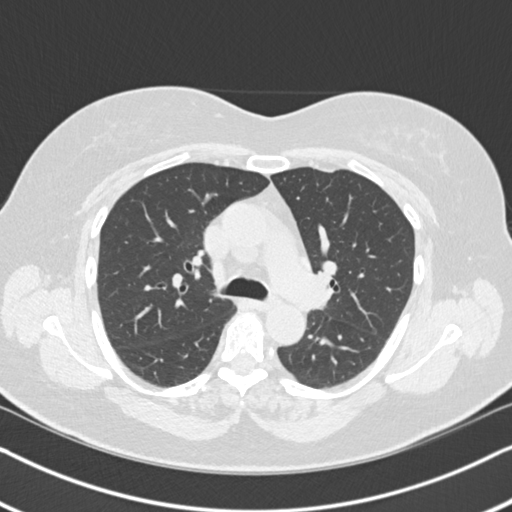
[im 99/141  mediastinal]
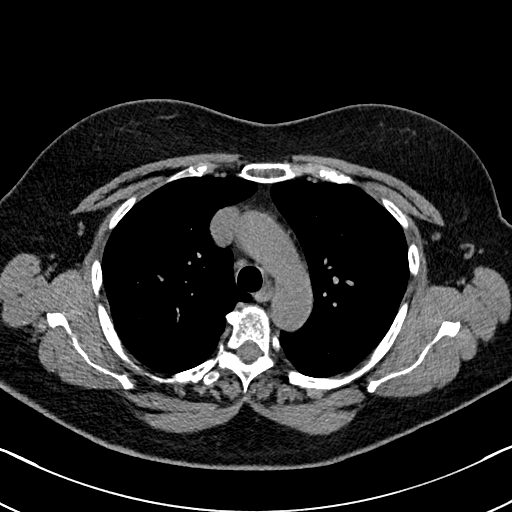
[im 99/141  lung]
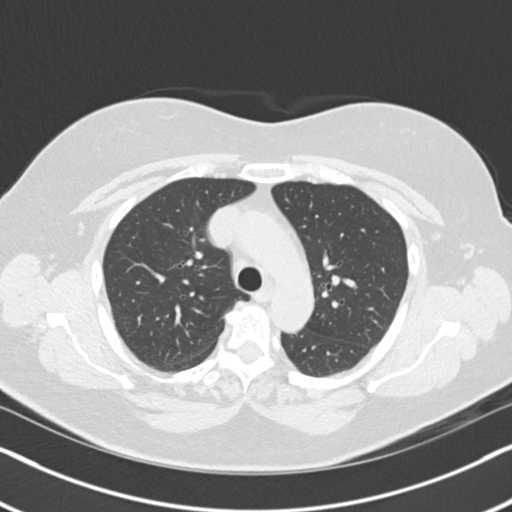
[im 109/141  lung]
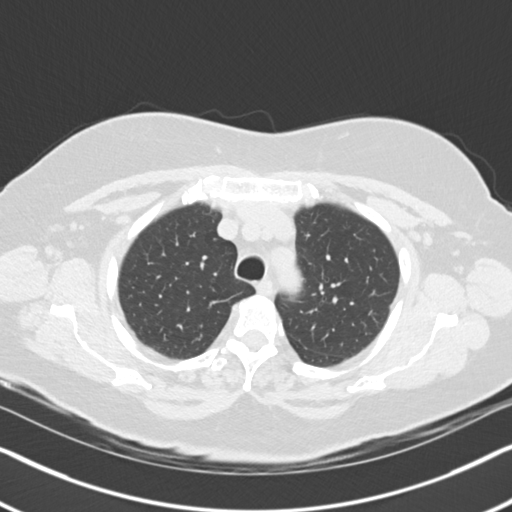
[im 120/141  lung]
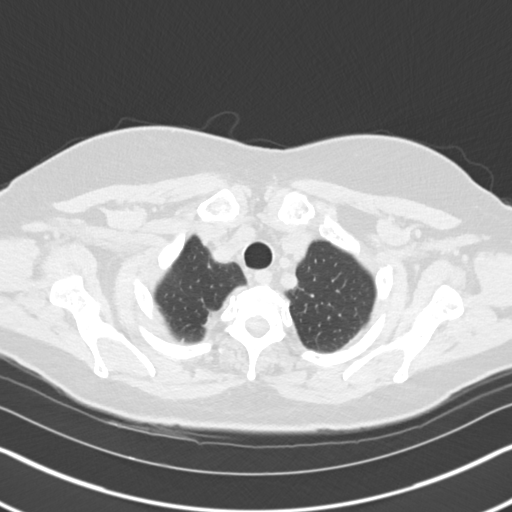
[im 130/141  lung]
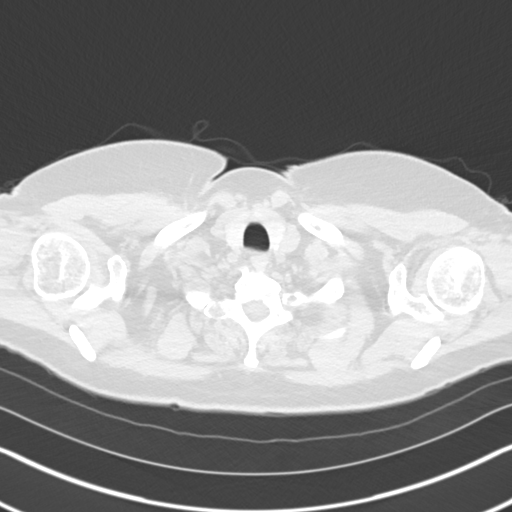

[Series 5: coronal · coronal · 0.59mm/px · 3 of 113 slices shown]
[im 23/113  lung]
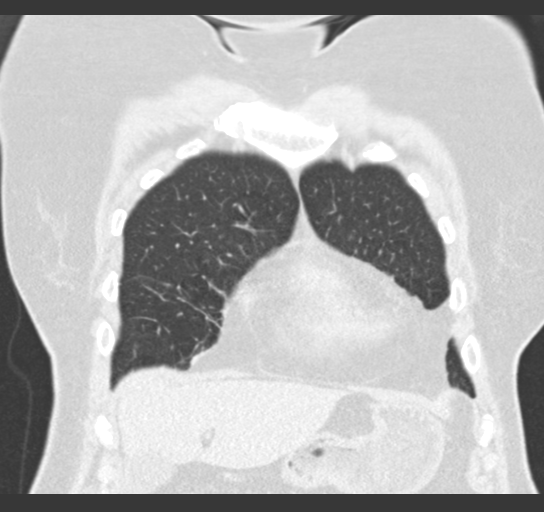
[im 45/113  lung]
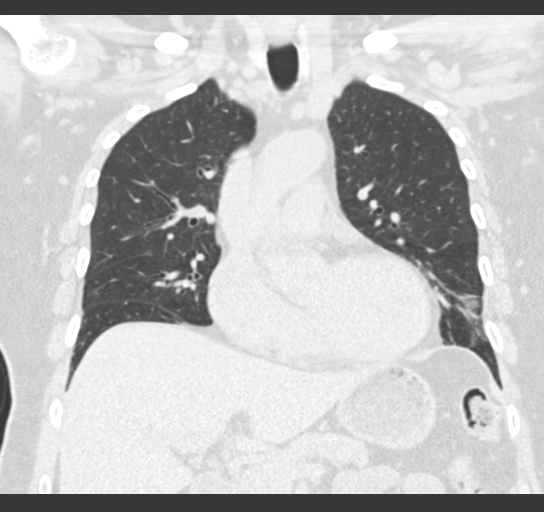
[im 68/113  lung]
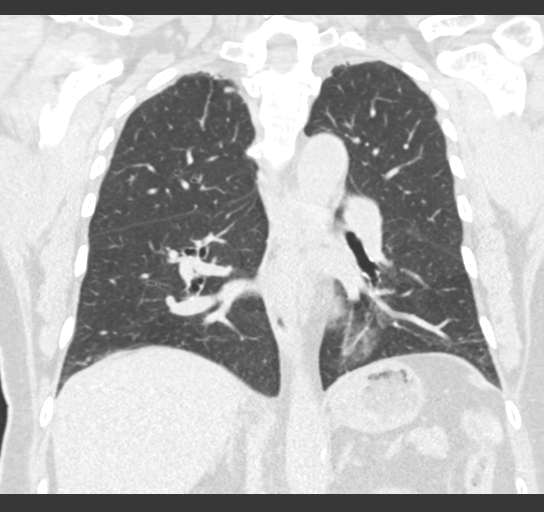

[15 of 36 positions shown; findings below may reference images not displayed]

FINDINGS: Cardiovascular: Vascular patency is not evaluated in the absence of
IV contrast. Mild for age calcified atherosclerosis of the visible
aorta, primarily in the upper abdomen. No cardiomegaly or
pericardial effusion.

Mediastinum/Nodes: Negative. No lymphadenopathy.

Lungs/Pleura: Major airways are patent with probable trace retained
secretions in the trachea on series 3, image 34. Unchanged linear
scarring in the lingula, medial segment of the right middle lobe,
and both anterior basal lower lobe segments. No associated nodular
pulmonary opacity. And no superimposed acute pulmonary findings. No
pleural effusion.

Upper Abdomen: Negative visible noncontrast liver, gallbladder,
spleen, pancreas, adrenal glands, kidneys and bowel.

Musculoskeletal: Thoracic spine disc and endplate degeneration with
intermittent vacuum disc. No acute or suspicious osseous lesion.
IMPRESSION: 1. Linear pulmonary scarring in the bilateral middle and lower lobes
is stable since last month.
2. No discrete pulmonary nodule. No superimposed acute findings in
the chest.

## 2023-12-23 ENCOUNTER — Ambulatory Visit: Attending: Cardiovascular Disease | Admitting: Cardiovascular Disease

## 2023-12-23 ENCOUNTER — Encounter: Payer: Self-pay | Admitting: Cardiovascular Disease

## 2023-12-23 VITALS — BP 110/62 | HR 59 | Ht 62.0 in | Wt 148.7 lb

## 2023-12-23 DIAGNOSIS — E782 Mixed hyperlipidemia: Secondary | ICD-10-CM

## 2023-12-23 DIAGNOSIS — I493 Ventricular premature depolarization: Secondary | ICD-10-CM

## 2023-12-23 DIAGNOSIS — G4733 Obstructive sleep apnea (adult) (pediatric): Secondary | ICD-10-CM | POA: Diagnosis not present

## 2023-12-23 DIAGNOSIS — R079 Chest pain, unspecified: Secondary | ICD-10-CM | POA: Diagnosis not present

## 2023-12-23 MED ORDER — PANTOPRAZOLE SODIUM 40 MG PO TBEC: 40.0000 mg | DELAYED_RELEASE_TABLET | Freq: Every day | ORAL | 3 refills | Status: AC

## 2023-12-23 NOTE — Progress Notes (Unsigned)
 12/23/2023 Kelly Stone   [birth year]  [id number]  Primary Physician Denney Sayre, MD Primary Cardiologist: Dorn Stone Lesches MD [facility] [place], [place]  HPI:  Kelly Stone is a 78 y.o.  mildly overweight widowed Caucasian female mother of 2, grandmother of 2 grandchildren referred by Dr. Maree in [facility] for evaluation treatment of intermittent PVCs.    Her current primary care provider is Dr. Sayre Denney.   I last saw her in the office 08/18/2020.  She basically has no risk factors other than family history with both parents who have had stents.  She has some mild hyperlipidemia not on statin therapy.  She is never had a heart attack or stroke and denies chest pain or shortness of breath.  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  Does not drink caffeine or alcohol.  She is had palpitations for years more noticeable and frequent over the last several months prior to her initial office visit..  Her primary care doctor obtain a event monitor that showed frequent PVCs.   A 2D echocardiogram was performed 10/03/2017 that was entirely normal.  She believes that her PVCs were stress related from not seeing her daughter.  Her symptoms have since resolved spontaneously.  I did prescribe low-dose metoprolol  which she ended up not taking because of borderline low blood pressure.  She does complain of some atypical symptoms which sound reflux related typically after eating.  She has been on antacids off and off in the past.  Her most recent lipid profile performed 12/31/2018 revealed a total cholesterol of 197, slightly elevated from her previous readings to 3 months before, LDL 129 up from 96 and HDL of 49.  She does admit to dietary indiscretion.   Since I saw her in the office 3 years ago she continues to do well.  She was diagnosed with CLL stage 0.  She still gets occasional PVCs.  She walks  twice a day without symptoms.  She still works as an Airline pilot twice a week however during taxation she works 50 to 60-hour weeks.  She has developed some upper abdominal, lower chest pain recently mostly at night when she lies down which sounds like reflux.  She did have a coronary calcium score 02/18/2019 which was 0.   No outpatient medications have been marked as taking for the 12/23/23 encounter (Office Visit) with Kelly Dorn JINNY, MD.     No Known Allergies  Social History   Socioeconomic History   Marital status: Widowed    Spouse name: Not on file   Number of children: Not on file   Years of education: Not on file   Highest education level: Not on file  Occupational History   Not on file  Tobacco Use   Smoking status: Never   Smokeless tobacco: Never  Vaping Use   Vaping status: Never Used  Substance and Sexual Activity   Alcohol use: Never   Drug use: Never   Sexual activity: Not on file  Other Topics Concern   Not on file  Social History Narrative   Not on file   Social Drivers of Health   Financial Resource Strain: Not on file  Food Insecurity: Not on file  Transportation Needs: Not on file  Physical Activity: Not on file  Stress: Not on file  Social Connections: Not on file  Intimate Partner Violence: Not on file     Review of Systems: General: negative for chills, fever, night sweats or weight changes.  Cardiovascular: negative for chest pain, dyspnea on exertion, edema, orthopnea, palpitations, paroxysmal nocturnal dyspnea or shortness of breath Dermatological: negative for rash Respiratory: negative for cough or wheezing Urologic: negative for hematuria Abdominal: negative for nausea, vomiting, diarrhea, bright red blood per rectum, melena, or hematemesis Neurologic: negative for visual changes, syncope, or dizziness All other systems reviewed and are otherwise negative except as noted above.    Blood pressure 110/62, pulse (!) 59, height 5' 2  (1.575 m), weight 148 lb 11.2 oz (67.4 kg), SpO2 97%.  General appearance: alert and no distress Neck: no adenopathy, no carotid bruit, no JVD, supple, symmetrical, trachea midline, and thyroid not enlarged, symmetric, no tenderness/mass/nodules Lungs: clear to auscultation bilaterally Heart: regular rate and rhythm, S1, S2 normal, no murmur, click, rub or gallop Extremities: extremities normal, atraumatic, no cyanosis or edema Pulses: 2+ and symmetric Skin: Skin color, texture, turgor normal. No rashes or lesions Neurologic: Grossly normal  EKG EKG Interpretation Date/Time:  Tuesday December 23 2023 14:56:15 EDT Ventricular Rate:  59 PR Interval:  124 QRS Duration:  80 QT Interval:  404 QTC Calculation: 399 R Axis:   20  Text Interpretation: Sinus bradycardia with sinus arrhythmia Low voltage QRS No previous ECGs available Confirmed by Stone Carrier [id number]) on 12/23/2023 3:11:02 PM    ASSESSMENT AND PLAN:   PVC's (premature ventricular contractions) History of PVCs in the past documented by her PCP by event monitoring.  These were related to stress in the past and are really are of little clinical significance to her.  Hyperlipidemia History of hyperlipidemia not on statin therapy with lipid profile performed 10/31/2023 revealing total cholesterol 157, LDL of 98 and HDL 42.  Obstructive sleep apnea History of obstructive sleep apnea on CPAP     Carrier Kelly Court MD [facility], [facility] 12/23/2023 3:23 PM

## 2023-12-23 NOTE — Assessment & Plan Note (Signed)
 History of PVCs in the past documented by her PCP by event monitoring.  These were related to stress in the past and are really are of little clinical significance to her.

## 2023-12-23 NOTE — Assessment & Plan Note (Signed)
 History of hyperlipidemia not on statin therapy with lipid profile performed 10/31/2023 revealing total cholesterol 157, LDL of 98 and HDL 42.

## 2023-12-23 NOTE — Assessment & Plan Note (Signed)
 History of obstructive sleep apnea on CPAP.

## 2023-12-23 NOTE — Patient Instructions (Signed)
 Medication Instructions:  Your physician has recommended you make the following change in your medication:   -Start pantoprazole  (protonix ) 40mg  once daily.   *If you need a refill on your cardiac medications before your next appointment, please call your pharmacy*  Follow-Up: At [facility], you and your health needs are our priority.  As part of our continuing mission to provide you with exceptional heart care, our providers are all part of one team.  This team includes your primary Cardiologist (physician) and Advanced Practice Providers or APPs (Physician Assistants and Nurse Practitioners) who all work together to provide you with the care you need, when you need it.  Your next appointment:   3 month(s)  Provider:   Orren Fabry, PA-C, Dayna Dunn, PA-C, Kathleen Johnson, PA-C, Damien Braver, NP, or Katlyn West, NP        Then, Dorn Lesches, MD will plan to see you again in 6 month(s). \

## 2024-02-03 ENCOUNTER — Encounter: Payer: Self-pay | Admitting: Cardiovascular Disease

## 2024-02-06 ENCOUNTER — Encounter: Payer: Self-pay | Admitting: Cardiovascular Disease

## 2024-02-06 DIAGNOSIS — R079 Chest pain, unspecified: Secondary | ICD-10-CM

## 2024-02-12 ENCOUNTER — Other Ambulatory Visit: Payer: Self-pay | Admitting: Cardiovascular Disease

## 2024-02-12 DIAGNOSIS — R079 Chest pain, unspecified: Secondary | ICD-10-CM

## 2024-02-13 ENCOUNTER — Ambulatory Visit (HOSPITAL_COMMUNITY): Admission: RE | Admit: 2024-02-13 | Source: Ambulatory Visit

## 2024-02-13 NOTE — Addendum Note (Signed)
 Addended by: CHAUVIGNE, Francene Mcerlean on: 02/13/2024 03:24 PM   Modules accepted: Orders

## 2024-02-25 ENCOUNTER — Ambulatory Visit (HOSPITAL_COMMUNITY)

## 2024-02-26 ENCOUNTER — Encounter: Payer: Self-pay | Admitting: Cardiovascular Disease

## 2024-02-26 DIAGNOSIS — I493 Ventricular premature depolarization: Secondary | ICD-10-CM

## 2024-02-26 DIAGNOSIS — E782 Mixed hyperlipidemia: Secondary | ICD-10-CM

## 2024-02-26 DIAGNOSIS — R079 Chest pain, unspecified: Secondary | ICD-10-CM

## 2024-02-27 ENCOUNTER — Telehealth (HOSPITAL_COMMUNITY): Payer: Self-pay | Admitting: *Deleted

## 2024-02-27 NOTE — Telephone Encounter (Signed)
 Reminder call given for upcoming GXT for 03/05/24 at 3:45

## 2024-03-01 ENCOUNTER — Ambulatory Visit: Attending: Cardiovascular Disease

## 2024-03-01 DIAGNOSIS — R079 Chest pain, unspecified: Secondary | ICD-10-CM

## 2024-03-01 DIAGNOSIS — E782 Mixed hyperlipidemia: Secondary | ICD-10-CM

## 2024-03-01 DIAGNOSIS — I493 Ventricular premature depolarization: Secondary | ICD-10-CM

## 2024-03-01 NOTE — Progress Notes (Unsigned)
 Enrolled for [employer] to mail a ZIO XT long term holter monitor to the patients address on file.

## 2024-03-03 ENCOUNTER — Ambulatory Visit (HOSPITAL_COMMUNITY)
Admission: RE | Admit: 2024-03-03 | Discharge: 2024-03-03 | Disposition: A | Discharge status: Home / Self Care | Payer: Self-pay | Source: Ambulatory Visit | Attending: Cardiology | Admitting: Cardiology

## 2024-03-03 DIAGNOSIS — E782 Mixed hyperlipidemia: Secondary | ICD-10-CM | POA: Insufficient documentation

## 2024-03-03 DIAGNOSIS — R079 Chest pain, unspecified: Secondary | ICD-10-CM | POA: Insufficient documentation

## 2024-03-03 DIAGNOSIS — I493 Ventricular premature depolarization: Secondary | ICD-10-CM | POA: Insufficient documentation

## 2024-03-04 ENCOUNTER — Ambulatory Visit: Payer: Self-pay | Admitting: Cardiovascular Disease

## 2024-03-05 ENCOUNTER — Ambulatory Visit (HOSPITAL_COMMUNITY)
Admission: RE | Admit: 2024-03-05 | Discharge: 2024-03-05 | Disposition: A | Discharge status: Home / Self Care | Source: Ambulatory Visit | Attending: Cardiovascular Disease | Admitting: Cardiovascular Disease

## 2024-03-05 DIAGNOSIS — R079 Chest pain, unspecified: Secondary | ICD-10-CM | POA: Insufficient documentation

## 2024-03-05 LAB — EXERCISE TOLERANCE TEST
Angina Index: 0
Duke Treadmill Score: 6
Estimated workload: 7
Exercise duration (min): 6 min
Exercise duration (sec): 0 s
MPHR: 144 {beats}/min
Peak HR: 129 {beats}/min
Percent HR: 89 %
Rest HR: 68 {beats}/min
ST Depression (mm): 0 mm

## 2024-03-06 ENCOUNTER — Ambulatory Visit: Payer: Self-pay | Admitting: Cardiovascular Disease

## 2024-03-16 ENCOUNTER — Encounter: Payer: Self-pay | Admitting: Cardiovascular Disease

## 2024-03-19 DIAGNOSIS — I493 Ventricular premature depolarization: Secondary | ICD-10-CM

## 2024-03-19 DIAGNOSIS — E782 Mixed hyperlipidemia: Secondary | ICD-10-CM | POA: Diagnosis not present

## 2024-03-19 DIAGNOSIS — R079 Chest pain, unspecified: Secondary | ICD-10-CM

## 2024-03-24 ENCOUNTER — Ambulatory Visit: Admitting: Physician Assistant

## 2024-03-24 ENCOUNTER — Ambulatory Visit: Admitting: Nurse Practitioner

## 2024-03-25 ENCOUNTER — Ambulatory Visit: Admitting: Nurse Practitioner

## 2024-04-06 ENCOUNTER — Telehealth (HOSPITAL_COMMUNITY): Payer: Self-pay | Admitting: *Deleted

## 2024-04-06 ENCOUNTER — Other Ambulatory Visit: Payer: Self-pay | Admitting: Cardiovascular Disease

## 2024-04-06 DIAGNOSIS — R079 Chest pain, unspecified: Secondary | ICD-10-CM

## 2024-04-06 NOTE — Telephone Encounter (Signed)
 Patient given detailed instructions per Myocardial Perfusion Study Information Sheet for the test on 04/09/24 Patient notified to arrive 15 minutes early and that it is imperative to arrive on time for appointment to keep from having the test rescheduled.  If you need to cancel or reschedule your appointment, please call the office within 24 hours of your appointment. . Patient verbalized understanding. Claudene Ronal Quale, RN

## 2024-04-09 ENCOUNTER — Encounter (HOSPITAL_COMMUNITY)

## 2024-04-12 NOTE — Progress Notes (Unsigned)
 " Cardiology Office Note:    Date:  04/13/2024   ID:  Kelly Stone, DOB July 19, 1945, MRN [id number]  PCP:  Denney Sayre, MD   [facility] Providers Cardiologist:  Dorn Lesches, MD     Referring MD: Denney Sayre, MD   Chief complaint: 53-month follow-up     History of Present Illness:   Kelly Stone is a 78 y.o. female with a hx of PVCs, IDA, GERD, HLD, OSA on CPAP, prediabetes presenting today for 39-month follow-up of PVCs.  Seen in 2019 by Dr. Lesches for evaluation following her PCPs cardiac event monitor showing frequent PVCs.  At the time she was noted to still be grieving the loss of her husband from 2 years prior.  Echocardiogram 10/03/2017 demonstrating LVEF 605%, normal LV function, no RWMA, normal diastolic parameters, normal AV, normal MV, normal RV.  Started on metoprolol  succinate 25 mg daily.  At follow-up a year later patient reported resolution in PVCs, was not taking prescribed metoprolol , believed to have been stress related.  With significant family history of cardiac disease, coronary calcium scoring was ordered.  CT cardiac scoring on 02/18/2019 showed CAC score of 0, normal coronary arteries with normal coronary origins.  Has historically been compliant on her CPAP.  Exercise tolerance test on 03/05/2024, stress ECG neg for ischemia, low risk treadmill score, METS 7.0.   Zio monitor worn for 6 days and 22 hours revealed: Patient had a min HR of 45 bpm, max HR of 176 bpm, and avg HR of 67 bpm. Predominant underlying rhythm was Sinus Rhythm. 8 Supraventricular Tachycardia runs occurred, the run with the fastest interval lasting 20 beats with a max rate of 176 bpm (avg 159 bpm); the run with the fastest interval was also the longest. Junctional Rhythm was present. Supraventricular Tachycardia and Junctional Rhythm were detected within +/- 45 seconds of symptomatic patient event(s). Isolated SVEs were rare (<1.0%), SVE  Couplets were rare (<1.0%), and SVE Triplets were  rare (<1.0%). Isolated VEs were rare (<1.0%, 2211), VE Triplets were rare (<1.0%, 1), and no VE Couplets were present.   Presents independently, appears stable from a cardiovascular standpoint. She denies chest pain,  dyspnea, orthopnea, n, v, dark/tarry/bloody stools, hematuria, dizziness, syncope, edema, weight gain.  She does report noticing increased palpitations over the last few months, occurring at rest and with exertion. It feels more like a pounding sensation or an extra heart beat. No associated symptoms. Historical palpitations exacerbated by increased periods of stress. She reports that since September, her 88 year old son was diagnosed with autism, ADHD, and anxiety, and moved back in with her until his medications can be adjusted. She states this has created increased stress, especially given that his wife also has anxiety which can be difficult to manage at times. Patient is trying to stay active by walking around a mile a day.   ROS:   Please see the history of present illness.    All other systems reviewed and are negative.     Past Medical History:  Diagnosis Date   Adenomatous colon polyp    GERD (gastroesophageal reflux disease)    HLD (hyperlipidemia)    Iron deficiency anemia    OSA (obstructive sleep apnea)    CPAP   Prediabetes    PVC's (premature ventricular contractions)     Past Surgical History:  Procedure Laterality Date   COLONOSCOPY     OVARIAN CYST REMOVAL     TUBAL LIGATION      Current  Medications: Active Medications[1]   Allergies:   Patient has no known allergies.   Social History   Socioeconomic History   Marital status: Widowed    Spouse name: Not on file   Number of children: Not on file   Years of education: Not on file   Highest education level: Not on file  Occupational History   Not on file  Tobacco Use   Smoking status: Never   Smokeless tobacco: Never  Vaping Use   Vaping status: Never Used  Substance and Sexual Activity    Alcohol use: Never   Drug use: Never   Sexual activity: Not on file  Other Topics Concern   Not on file  Social History Narrative   Not on file   Social Drivers of Health   Tobacco Use: Low Risk (04/13/2024)   Patient History    Smoking Tobacco Use: Never    Smokeless Tobacco Use: Never    Passive Exposure: Not on file  Financial Resource Strain: Not on file  Food Insecurity: Not on file  Transportation Needs: Not on file  Physical Activity: Not on file  Stress: Not on file  Social Connections: Not on file  Depression (EYV7-0): Not on file  Alcohol Screen: Not on file  Housing: Not on file  Utilities: Not on file  Health Literacy: Not on file     Family History: The patient's family history includes Arthritis in her father and sister; Breast cancer in her sister; CAD in her father and mother; Diabetes in her brother and mother; Hyperlipidemia in her brother; Hypertension in her brother; Stroke in her brother and father.  EKGs/Labs/Other Studies Reviewed:    The following studies were reviewed today:  EKG Interpretation Date/Time:  Tuesday April 13 2024 08:10:42 EST Ventricular Rate:  54 PR Interval:  152 QRS Duration:  84 QT Interval:  422 QTC Calculation: 400 R Axis:   31  Text Interpretation: Sinus bradycardia No significant change from prior study Confirmed by Jalexis Breed [phone]) on 04/13/2024 8:21:56 AM    Recent Labs: No results found for requested labs within last 365 days.  Recent Lipid Panel    Component Value Date/Time   CHOL 182 11/20/2017 0000   TRIG 90 11/20/2017 0000   HDL 53 11/20/2017 0000   CHOLHDL 3.4 11/20/2017 0000   LDLCALC 111 (H) 11/20/2017 0000     Risk Assessment/Calculations:                Physical Exam:    VS:  BP 132/74 (Cuff Size: Normal)   Pulse (!) 54   Ht 5' 1 (1.549 m)   Wt 149 lb (67.6 kg)   SpO2 97%   BMI 28.15 kg/m        Wt Readings from Last 3 Encounters:  04/13/24 149 lb (67.6 kg)   12/23/23 148 lb 11.2 oz (67.4 kg)  08/18/20 151 lb 6.4 oz (68.7 kg)     GEN:  Well nourished, well developed in no acute distress HEENT: Normal NECK:  No carotid bruits CARDIAC:  S1-S2 normal, RRR, no murmurs, rubs, gallops RESPIRATORY:  Clear to auscultation without rales, wheezing or rhonchi  MUSCULOSKELETAL:  No edema; No deformity  SKIN: Warm and dry NEUROLOGIC:  Alert and oriented x 3 PSYCHIATRIC:  Normal affect       Assessment & Plan PVC's (premature ventricular contractions) Zio: SB/ST occasionally, predominantly NSR. 8 runs of SVT, longest and fasting lasting 20 beats up to 176 bpm. Patient detected SVT. Junctional  rhythm present. Rare ectopy. Palpitations historically related to stress. She reports recent stressor in her home life as above.  Patient not interested in daily betablocker. Will prescribe one as needed for increased palpitations ED precautions given for symptoms that worsen or do not resolve Start propranolol 10 mg BID PRN palpitations.  Mixed hyperlipidemia HLD, LDL goal <100 10/31/2023 lipid panel: Cholesterol 157, LDL of 98 and HDL 42 Historical statin use, not currently taking at this time Discussed possibility of more aggressive LDL control with a goal of 70 given history of aortic atherosclerosis. Patient not currently interested in statin therapy at this time.  Obstructive sleep apnea Compliant on CPAP  Disposition: Follow up with Dr. Court in March-April as scheduled. Proceed to ED with any new/worsening symptoms.            Medication Adjustments/Labs and Tests Ordered: Current medicines are reviewed at length with the patient today.  Concerns regarding medicines are outlined above.  Orders Placed This Encounter  Procedures   EKG 12-Lead   Meds ordered this encounter  Medications   propranolol (INDERAL) 10 MG tablet    Sig: Take 1 tablet (10 mg total) by mouth daily as needed.    Dispense:  60 tablet    Refill:  3    Patient  Instructions  Medication Instructions:  Start Propranolol 10 mg twice daily as needed for palpitations.   *If you need a refill on your cardiac medications before your next appointment, please call your pharmacy*  Lab Work: NONE ordered at this time of appointment   Testing/Procedures: NONE ordered at this time of appointment   Follow-Up: At [facility], you and your health needs are our priority.  As part of our continuing mission to provide you with exceptional heart care, our providers are all part of one team.  This team includes your primary Cardiologist (physician) and Advanced Practice Providers or APPs (Physician Assistants and Nurse Practitioners) who all work together to provide you with the care you need, when you need it.  Your next appointment:    4 months with Dr. Court per Recall   We recommend signing up for the patient portal called MyChart.  Sign up information is provided on this After Visit Summary.  MyChart is used to connect with patients for Virtual Visits (Telemedicine).  Patients are able to view lab/test results, encounter notes, upcoming appointments, etc.  Non-urgent messages can be sent to your provider as well.   To learn more about what you can do with MyChart, go to [email or web address].   Other Instructions Proceed to the ED if symptoms worsen or don't improve in a short period of time.            Signed, Miriam FORBES Shams, NP  04/13/2024 9:26 AM    [facility]     [1]  Current Meds  Medication Sig   cetirizine (ZYRTEC) 10 MG tablet Take 10 mg by mouth as needed for allergies or rhinitis.   Multiple Vitamins-Minerals (MULTIVITAMIN ADULT PO) Take 1 tablet by mouth daily.   propranolol (INDERAL) 10 MG tablet Take 1 tablet (10 mg total) by mouth daily as needed.   "

## 2024-04-13 ENCOUNTER — Ambulatory Visit: Attending: Nurse Practitioner | Admitting: Emergency Medicine

## 2024-04-13 ENCOUNTER — Encounter: Payer: Self-pay | Admitting: Nurse Practitioner

## 2024-04-13 VITALS — BP 132/74 | HR 54 | Ht 61.0 in | Wt 149.0 lb

## 2024-04-13 DIAGNOSIS — E782 Mixed hyperlipidemia: Secondary | ICD-10-CM

## 2024-04-13 DIAGNOSIS — I493 Ventricular premature depolarization: Secondary | ICD-10-CM | POA: Diagnosis not present

## 2024-04-13 DIAGNOSIS — G4733 Obstructive sleep apnea (adult) (pediatric): Secondary | ICD-10-CM | POA: Diagnosis not present

## 2024-04-13 MED ORDER — PROPRANOLOL HCL 10 MG PO TABS: 10.0000 mg | ORAL_TABLET | Freq: Every day | ORAL | 3 refills | Status: DC | PRN

## 2024-04-13 NOTE — Assessment & Plan Note (Signed)
Compliant on CPAP

## 2024-04-13 NOTE — Assessment & Plan Note (Signed)
 HLD, LDL goal <100 10/31/2023 lipid panel: Cholesterol 157, LDL of 98 and HDL 42 Historical statin use, not currently taking at this time Discussed possibility of more aggressive LDL control with a goal of 70 given history of aortic atherosclerosis. Patient not currently interested in statin therapy at this time.

## 2024-04-13 NOTE — Patient Instructions (Signed)
 Medication Instructions:  Start Propranolol 10 mg twice daily as needed for palpitations.   *If you need a refill on your cardiac medications before your next appointment, please call your pharmacy*  Lab Work: NONE ordered at this time of appointment   Testing/Procedures: NONE ordered at this time of appointment   Follow-Up: At [facility], you and your health needs are our priority.  As part of our continuing mission to provide you with exceptional heart care, our providers are all part of one team.  This team includes your primary Cardiologist (physician) and Advanced Practice Providers or APPs (Physician Assistants and Nurse Practitioners) who all work together to provide you with the care you need, when you need it.  Your next appointment:    4 months with Dr. Court per Recall   We recommend signing up for the patient portal called MyChart.  Sign up information is provided on this After Visit Summary.  MyChart is used to connect with patients for Virtual Visits (Telemedicine).  Patients are able to view lab/test results, encounter notes, upcoming appointments, etc.  Non-urgent messages can be sent to your provider as well.   To learn more about what you can do with MyChart, go to [email or web address].   Other Instructions Proceed to the ED if symptoms worsen or don't improve in a short period of time.

## 2024-04-13 NOTE — Assessment & Plan Note (Addendum)
 Zio: SB/ST occasionally, predominantly NSR. 8 runs of SVT, longest and fasting lasting 20 beats up to 176 bpm. Patient detected SVT. Junctional rhythm present. Rare ectopy. Palpitations historically related to stress. She reports recent stressor in her home life as above.  Patient not interested in daily betablocker. Will prescribe one as needed for increased palpitations ED precautions given for symptoms that worsen or do not resolve Start propranolol 10 mg BID PRN palpitations.

## 2024-04-14 ENCOUNTER — Other Ambulatory Visit: Payer: Self-pay | Admitting: Emergency Medicine

## 2024-04-14 MED ORDER — PROPRANOLOL HCL 10 MG PO TABS: 10.0000 mg | ORAL_TABLET | Freq: Two times a day (BID) | ORAL | Status: AC | PRN

## 2024-04-14 NOTE — Progress Notes (Signed)
 Prescription frequency changed, may take up to twice daily as needed for palpitations.

## 2024-06-30 ENCOUNTER — Ambulatory Visit: Admitting: Cardiovascular Disease
